# Patient Record
Sex: Female | Born: 1985 | Race: White | Hispanic: No | Marital: Single | Smoking: Former smoker
Health system: Southern US, Community
[De-identification: ages and names within clinical notes are randomized; demographics above are authoritative.]

## PROBLEM LIST (undated history)

## (undated) DIAGNOSIS — Z720 Tobacco use: Secondary | ICD-10-CM

## (undated) DIAGNOSIS — R519 Headache, unspecified: Secondary | ICD-10-CM

## (undated) DIAGNOSIS — IMO0002 Reserved for concepts with insufficient information to code with codable children: Secondary | ICD-10-CM

## (undated) DIAGNOSIS — Z789 Other specified health status: Secondary | ICD-10-CM

## (undated) DIAGNOSIS — O039 Complete or unspecified spontaneous abortion without complication: Secondary | ICD-10-CM

## (undated) HISTORY — PX: TONSILLECTOMY: SUR1361

## (undated) HISTORY — DX: Complete or unspecified spontaneous abortion without complication: O03.9

## (undated) HISTORY — DX: Other specified health status: Z78.9

## (undated) HISTORY — DX: Tobacco use: Z72.0

## (undated) HISTORY — DX: Reserved for concepts with insufficient information to code with codable children: IMO0002

## (undated) HISTORY — PX: OTHER SURGICAL HISTORY: SHX169

---
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
## 2005-02-06 ENCOUNTER — Ambulatory Visit: Payer: Self-pay | Admitting: Family Medicine

## 2005-09-03 ENCOUNTER — Ambulatory Visit: Payer: Self-pay | Admitting: Family Medicine

## 2006-09-08 ENCOUNTER — Ambulatory Visit: Payer: Self-pay | Admitting: Family Medicine

## 2006-09-08 ENCOUNTER — Encounter (INDEPENDENT_AMBULATORY_CARE_PROVIDER_SITE_OTHER): Payer: Self-pay | Admitting: Family Medicine

## 2006-09-08 LAB — CONVERTED CEMR LAB
Antibody Screen: NEGATIVE
Eosinophils Absolute: 0.1 10*3/uL (ref 0.0–0.7)
Eosinophils Relative: 1 % (ref 0–5)
HCT: 37.7 % (ref 36.0–46.0)
Hemoglobin: 13.3 g/dL (ref 12.0–15.0)
Hepatitis B Surface Ag: NEGATIVE
Lymphocytes Relative: 25 % (ref 12–46)
Lymphs Abs: 1.7 10*3/uL (ref 0.7–3.3)
MCV: 92 fL (ref 78.0–100.0)
Neutro Abs: 4.7 10*3/uL (ref 1.7–7.7)
Neutrophils Relative %: 67 % (ref 43–77)
Platelets: 294 10*3/uL (ref 150–400)
Rh Type: POSITIVE
Rubella: 9 intl units/mL — ABNORMAL HIGH

## 2006-09-10 ENCOUNTER — Inpatient Hospital Stay (HOSPITAL_COMMUNITY): Admission: AD | Admit: 2006-09-10 | Discharge: 2006-09-10 | Payer: Self-pay | Admitting: Obstetrics & Gynecology

## 2006-10-07 DIAGNOSIS — F329 Major depressive disorder, single episode, unspecified: Secondary | ICD-10-CM | POA: Insufficient documentation

## 2008-05-11 ENCOUNTER — Emergency Department (HOSPITAL_COMMUNITY): Admission: EM | Admit: 2008-05-11 | Discharge: 2008-05-11 | Payer: Self-pay | Admitting: Family Medicine

## 2008-12-21 ENCOUNTER — Emergency Department (HOSPITAL_COMMUNITY): Admission: EM | Admit: 2008-12-21 | Discharge: 2008-12-21 | Payer: Self-pay | Admitting: Family Medicine

## 2008-12-26 ENCOUNTER — Encounter (INDEPENDENT_AMBULATORY_CARE_PROVIDER_SITE_OTHER): Payer: Self-pay | Admitting: Family Medicine

## 2008-12-26 ENCOUNTER — Ambulatory Visit: Payer: Self-pay | Admitting: Family Medicine

## 2009-02-06 ENCOUNTER — Telehealth: Payer: Self-pay | Admitting: *Deleted

## 2009-02-14 ENCOUNTER — Ambulatory Visit: Payer: Self-pay | Admitting: Family Medicine

## 2009-02-14 ENCOUNTER — Encounter: Payer: Self-pay | Admitting: Family Medicine

## 2009-02-14 LAB — CONVERTED CEMR LAB: Beta hcg, urine, semiquantitative: NEGATIVE

## 2010-03-21 ENCOUNTER — Ambulatory Visit: Payer: Self-pay | Admitting: Family Medicine

## 2010-03-26 ENCOUNTER — Ambulatory Visit: Payer: Self-pay | Admitting: Family Medicine

## 2010-03-26 ENCOUNTER — Encounter: Payer: Self-pay | Admitting: Family Medicine

## 2010-03-26 LAB — CONVERTED CEMR LAB
ABO/RH(D): AB POS
Antibody Screen: NEGATIVE
Basophils Absolute: 0 10*3/uL (ref 0.0–0.1)
Basophils Relative: 0 % (ref 0–1)
Eosinophils Relative: 1 % (ref 0–5)
HCT: 37.7 % (ref 36.0–46.0)
Hemoglobin: 12.7 g/dL (ref 12.0–15.0)
Lymphocytes Relative: 24 % (ref 12–46)
MCHC: 33.7 g/dL (ref 30.0–36.0)
MCV: 94.3 fL (ref 78.0–100.0)
Monocytes Absolute: 0.4 10*3/uL (ref 0.1–1.0)
Monocytes Relative: 4 % (ref 3–12)
Neutrophils Relative %: 71 % (ref 43–77)
RBC: 4 M/uL (ref 3.87–5.11)
Rubella: 6.9 intl units/mL — ABNORMAL HIGH
Sickle Cell Screen: NEGATIVE
WBC: 8.9 10*3/uL (ref 4.0–10.5)
hCG, Beta Chain, Quant, S: 73846 milliintl units/mL

## 2010-03-28 ENCOUNTER — Ambulatory Visit: Payer: Self-pay | Admitting: Family Medicine

## 2010-03-28 ENCOUNTER — Encounter: Payer: Self-pay | Admitting: Family Medicine

## 2010-04-03 ENCOUNTER — Telehealth: Payer: Self-pay | Admitting: Family Medicine

## 2010-04-07 ENCOUNTER — Encounter: Payer: Self-pay | Admitting: Family Medicine

## 2010-04-07 ENCOUNTER — Ambulatory Visit: Payer: Self-pay | Admitting: Family Medicine

## 2010-04-07 DIAGNOSIS — R87619 Unspecified abnormal cytological findings in specimens from cervix uteri: Secondary | ICD-10-CM

## 2010-04-07 DIAGNOSIS — IMO0002 Reserved for concepts with insufficient information to code with codable children: Secondary | ICD-10-CM

## 2010-04-07 HISTORY — DX: Reserved for concepts with insufficient information to code with codable children: IMO0002

## 2010-04-07 HISTORY — DX: Unspecified abnormal cytological findings in specimens from cervix uteri: R87.619

## 2010-04-07 LAB — CONVERTED CEMR LAB: Chlamydia, DNA Probe: NEGATIVE

## 2010-04-10 ENCOUNTER — Ambulatory Visit (HOSPITAL_COMMUNITY): Admission: RE | Admit: 2010-04-10 | Discharge: 2010-04-10 | Payer: Self-pay | Admitting: Family Medicine

## 2010-04-10 ENCOUNTER — Encounter: Payer: Self-pay | Admitting: Family Medicine

## 2010-04-21 ENCOUNTER — Ambulatory Visit: Payer: Self-pay | Admitting: Family Medicine

## 2010-04-21 DIAGNOSIS — R87612 Low grade squamous intraepithelial lesion on cytologic smear of cervix (LGSIL): Secondary | ICD-10-CM | POA: Insufficient documentation

## 2010-04-21 HISTORY — DX: Low grade squamous intraepithelial lesion on cytologic smear of cervix (LGSIL): R87.612

## 2010-04-21 LAB — CONVERTED CEMR LAB

## 2010-05-05 ENCOUNTER — Ambulatory Visit: Payer: Self-pay | Admitting: Family Medicine

## 2010-05-08 ENCOUNTER — Telehealth: Payer: Self-pay | Admitting: *Deleted

## 2010-06-05 ENCOUNTER — Ambulatory Visit: Payer: Self-pay | Admitting: Family Medicine

## 2010-06-13 ENCOUNTER — Ambulatory Visit (HOSPITAL_COMMUNITY): Admission: RE | Admit: 2010-06-13 | Discharge: 2010-06-13 | Payer: Self-pay | Admitting: Family Medicine

## 2010-06-21 ENCOUNTER — Telehealth: Payer: Self-pay | Admitting: Family Medicine

## 2010-06-23 ENCOUNTER — Telehealth: Payer: Self-pay | Admitting: *Deleted

## 2010-07-16 ENCOUNTER — Telehealth: Payer: Self-pay | Admitting: Family Medicine

## 2010-07-21 ENCOUNTER — Ambulatory Visit: Payer: Self-pay | Admitting: Family Medicine

## 2010-08-07 ENCOUNTER — Ambulatory Visit: Admission: RE | Admit: 2010-08-07 | Discharge: 2010-08-07 | Payer: Self-pay | Source: Home / Self Care

## 2010-08-07 ENCOUNTER — Encounter: Payer: Self-pay | Admitting: Family Medicine

## 2010-08-08 LAB — CONVERTED CEMR LAB
HCT: 33.3 % — ABNORMAL LOW (ref 36.0–46.0)
HIV: NONREACTIVE
MCV: 96.2 fL (ref 78.0–100.0)
RDW: 14 % (ref 11.5–15.5)

## 2010-08-14 ENCOUNTER — Ambulatory Visit: Admit: 2010-08-14 | Payer: Self-pay | Admitting: Family Medicine

## 2010-08-25 ENCOUNTER — Encounter: Payer: Self-pay | Admitting: Family Medicine

## 2010-08-28 ENCOUNTER — Ambulatory Visit: Payer: Self-pay | Admitting: *Deleted

## 2010-08-28 ENCOUNTER — Ambulatory Visit: Admission: RE | Admit: 2010-08-28 | Discharge: 2010-08-28 | Payer: Self-pay | Source: Home / Self Care

## 2010-08-28 DIAGNOSIS — K219 Gastro-esophageal reflux disease without esophagitis: Secondary | ICD-10-CM

## 2010-08-28 HISTORY — DX: Gastro-esophageal reflux disease without esophagitis: K21.9

## 2010-09-11 NOTE — Assessment & Plan Note (Signed)
Summary: NOB 11 2/7   Vital Signs:  Patient profile:   25 year old female LMP:     01/18/2010 Height:      61.0 inches Weight:      123 pounds BMI:     23.32 BSA:     1.54 Temp:     98.4 degrees F Pulse rate:   83 / minute BP sitting:   111 / 68  Vitals Entered By: Jone Baseman CMA (April 07, 2010 8:35 AM) CC: NOB LMP (date): 01/18/2010 EDC by LMP==> 10/25/2010 EDC 10/25/2010 LMP - Reliable? approximate (month known) Menarche (age onset years): 12   Menses interval (days): 30 Menstrual flow (days): 7 On BCP's at conception: no Date of + home preg. test: 03/19/2010 Enter LMP: 01/18/2010   CC:  NOB.  Habits & Providers  Alcohol-Tobacco-Diet     Cigarette Packs/Day: n/a  Family History: ? Dad bipolar, No family h/o depression, bipolar, migraines  Mother with DM. Father died 09-11-2009 from emphysema (smoker, was pt at [facility]) age 69. No CA/CAD  Social History: Lives with mother.  BF excited about pregnancy and supportive.  Occ MJ use.  Quit smoking cigarettes when found out she was pregnant.  + second hand smoke (mother).  Unemployed.  Education:  GED Packs/Day:  n/a  Physical Exam  General:  Thin,in no acute distress; alert,appropriate and cooperative throughout examination Head:  Normocephalic and atraumatic without obvious abnormalities. No apparent alopecia or balding. Eyes:  No corneal or conjunctival inflammation noted. EOMI. Perrla. Vision grossly normal. Ears:  External ear exam shows no significant lesions or deformities.  Otoscopic examination reveals clear canals, tympanic membranes are intact bilaterally without bulging, retraction, inflammation or discharge. Hearing is grossly normal bilaterally. Mouth:  Oral mucosa and oropharynx without lesions or exudates.  Fair dentition. Neck:  No deformities, masses, or tenderness noted. No thyromegaly Breasts:  No mass, nodules, thickening, tenderness, bulging, retraction, inflamation, nipple discharge or skin  changes noted.  No axillary LAD Lungs:  Normal respiratory effort, chest expands symmetrically. Lungs are clear to auscultation, no crackles or wheezes. Heart:  Normal rate and regular rhythm. S1 and S2 normal without gallop, murmur, click, rub or other extra sounds. Abdomen:  ,abdomen soft and non-tender without masses, organomegaly or hernias noted. Genitalia:  Normal introitus for age, no external lesions, white vaginal discharge, mucosa pink and moist, no vaginal or cervical lesions, no vaginal atrophy, no friaility or hemorrhage, uterus c/w 10 weeks size, no adnexal masses or tenderness Psych:  Cognition and judgment appear intact. Alert and cooperative with normal attention span and concentration. No apparent delusions, illusions, hallucinations.  Appropriately anxious about viability of pregnancy given her h/o losses x 2   Impression & Recommendations:  Problem # 1:  PREGNANCY (ICD-V22.2) Doing well.  FHT not heard today but visualized on ultrasound. No need for early glucola (1 RF of mother with DM) Declined genetic screening. Quit cigarettes, but occ marijuana.  Encouraged cessation. Routine precautions given today. Check ultrasound to confirm dates Sept 1st. Orders: Pap Smear-[facility] ([id number]) GC/Chlamydia-[facility] (87591/87491) Prenatal U/S < 14 weeks - 29562  (Prenatal U/S) Other OB visit- [facility] (OBCK)  Problem # 2:  HYPEREMESIS GRAVIDARUM, MILD (ICD-643.10)  Trial of Zofran, B6 and Unisom.  Follow up 1 week to make sure she has improved and weight is at least stable.  Orders: Other OB visit- [facility] (OBCK)  Complete Medication List: 1)  Prenatal/iron Tabs (Prenatal multivit-min-fe-fa) 2)  Zofran Odt 8 Mg Tbdp (Ondansetron) .Marland Kitchen.. 1 by mouth  three times a day as needed nausea and vomiting  Patient Instructions: 1)  Things look good today.   2)  It's great that you quit smoking!  Now we should try to help your mother quit before the baby comes. 3)  Please schedule an appt in 1 week  with Dr. Fara Boros to see how the vomiting is doing. 4)  We have the appt scheduled for September 1st to get the ultrasound to check on the pregnancy. 5)  If you have severe pain or bleeding, please call us or go to [facility]. 6)  Congratulations! Prescriptions: ZOFRAN ODT 8 MG TBDP (ONDANSETRON) 1 by mouth three times a day as needed nausea and vomiting  #30 x 1   Entered and Authorized by:   Sean Macwilliams [place] MD   Signed by:   Aarilyn Dye [place] MD on 04/07/2010   Method used:   Electronically to        [facility] (retail)       [address]       [place], [place]  [address]       Ph: [phone]       Fax: [phone]   RxID:   [id number]   Prenatal Visit    FOB name: Truett Perna Concerns noted: Pt with h/o miscarriage x 2.  Very nervous about this pregnancy.  Desired pregnancy. EDC Confirmation:    New working [facility]: 10/25/2010    LMP reliable? 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 : approximate (month known)    Menarche: 12 years    Menses interval: 30 days    Menstrual flow 7 days    On BCP's at conception: no    Date of positive (+) home preg. test: 03/19/2010    Pre Pregnancy Weight: 125 lbs.    Symptoms since LMP: amenorrhea, nausea, vomiting, fatigue, tender breasts, urinary frequency    Other symptoms: Constipation  Prenatal Visit    FOB name: Truett Perna Concerns noted: Very anxious about pregnancy in light of previous SABs EDC Confirmation:    New working [facility] 10/25/2010    LMP reliable? approximate (month known)    Last menses onset (LMP) date: 01/18/2010    EDC by LMP: 10/25/2010   Past Pregnancy History    Gravida:     3    Term Births:     0    Premature Births:   0    Living Children:   0    Aborta:     2    Spont. Ab:     2  Pregnancy # 1    Comments:     SAB at about 12-13 weeks in 2007  Pregnancy # 2    Comments:     SAB at 7-8 weeks in 2010   Genetic History     Thalassemia:     mother: no    Neural tube defect:   mother: no    Down's Syndrome:   mother: no    Tay-Sachs:     mother: no    Sickle Cell Dz/Trait:   mother: no    Hemophilia:     mother: no    Muscular Dystrophy:   mother: no    Cystic Fibrosis:   mother: no    Huntington's Dz:   mother: no    Mental Retardation:   mother: yes       comments: ? sister's daughter with learning problems    Fragile X:     mother: no    Other Genetic or       Chromosomal Dz:   mother: no    Child with other       birth defect:     mother: no    > 3 spont. abortions:   mother: no    Hx of stillbirth:     mother: no  Additional Genetic Comments:    Father's family healthy.  Pt declines genetic testing.  Environmental Exposures    Medication, drug, or alcohol use since LMP: yes  Additional Infection/Environmental Comments:    Rare marijuana use - states it helps with appetite and nausea.   Flowsheet View for Follow-up Visit    Estimated weeks of       gestation:     11 2/7    Weight:     123    Blood pressure:   111 / 68    Headache:     +    Nausea/vomiting:   freq    Edema:     0    Vaginal bleeding:   no    Vaginal discharge:   d/c    FHR:       seen on ultrasound    Cx Dilation:     0    Cx Effacement:   0%    Cx Station:     high    Taking prenatal vits?   Y    Smoking:     n/a    Next visit:     1 wk    Comment:     Clear or white  discharge, no odor, no itching.  FHT not heard with doppler, but visualized with ultrasound  Appended Document: NOB 11 2/7     Clinical Lists Changes  Problems: Added new problem of RUBELLA NON-IMMUNE (ICD-V04.3)      Appended Document: NOB 11 2/7 PHQ-9 = 13, due to pregnancy related fatigue.

## 2010-09-11 NOTE — Assessment & Plan Note (Signed)
Summary: Denied phenergan refill  Denied fax refill request for phenergan.  She is well out of first trimester - it is not normal for her to have ongoing nausea requiring phenergan.  Needs an appointment

## 2010-09-11 NOTE — Assessment & Plan Note (Signed)
Summary: 10 weeks, still vomiting   Vital Signs:  Patient profile:   25 year old female Height:      61.0 inches Weight:      122.1 pounds Pulse rate:   84 / minute BP sitting:   114 / 71  (right arm) Cuff size:   regular  Vitals Entered By: Tessie Fass CMA (April 21, 2010 9:41 AM) CC: F/U vomiting Pain Assessment Patient in pain? no      LMP (date): 01/18/2010 EDC 11/13/2010   CC:  F/U vomiting.  Habits & Providers  Alcohol-Tobacco-Diet     Cigarette Packs/Day: n/a  Social History: Hepatitis Risk:  no  Physical Exam  General:  Well-developed,well-nourished,in no acute distress; alert,appropriate and cooperative throughout examination Mouth:  Poor dentition Lungs:  Normal respiratory effort, chest expands symmetrically. Lungs are clear to auscultation, no crackles or wheezes. Heart:  Normal rate and regular rhythm. S1 and S2 normal without gallop, murmur, click, rub or other extra sounds. Neurologic:  CN II-XII intact except VF not assessed.  Strength 5/5 thoroughout.  Sensation intact.  DTR's 4+ throughout.  No clonus. Rapid alternating movements and finger to nose intact.  Nl Heel, toe and tandem gait.  No pronator drift. Neg Romberg.   Impression & Recommendations:  Problem # 1:  PREGNANCY (ICD-V22.2) Doing well.  New EDD based on early ultrasound.  Bleeding and pain precautions given  Problem # 2:  HYPEREMESIS GRAVIDARUM, MILD (ICD-643.10) Can try outpatient pharmacy for ondansetron, o/w may try phenergan.  ENcouraged small frequent meals.  Well hydrated.  Small weight loss.  Follow up in 2 weeks, sooner if needed.  Problem # 3:  HEADACHE, UNSPECIFIED (ICD-784.0) Normal neuro exam.  Likely will improve with improved GI status.  Continue apap.    Problem # 4:  PAP SMEAR, LGSIL, ABNORMAL (ICD-795.09) Refer to colpo clinic.  Complete Medication List: 1)  Prenatal/iron Tabs (Prenatal multivit-min-fe-fa) 2)  Zofran Odt 8 Mg Tbdp (Ondansetron) .Marland Kitchen.. 1 by  mouth three times a day as needed nausea and vomiting 3)  Promethazine Hcl 25 Mg Tabs (Promethazine hcl) .Marland Kitchen.. 1 by mouth q 6 hours as needed nausea/vomiting.  will make you sleepy  Patient Instructions: 1)  I will give you a prescription for the Zofran.  You can try to take it to the [facility].   2)  I will send the phenergan prescription to the [facility] on [address]. 3)  Please see [place] in 2 weeks with Dr.McGill.   4)  Please go to the [facility] or call us if you have severe pain or bleeding. Prescriptions: PROMETHAZINE HCL 25 MG TABS (PROMETHAZINE HCL) 1 by mouth q 6 hours as needed nausea/vomiting.  Will make you sleepy  #60 x 1   Entered and Authorized by:   Sarah Swaziland MD   Signed by:   Sarah Swaziland MD on 04/21/2010   Method used:   Electronically to        [facility] (retail)       [address]       [place], [place]  [address]       Ph: [phone]       Fax: [phone]   RxID:   [id number]   Prenatal Visit Concerns noted: Still vomiting.  Able to keep fluids down, but not solids until evening. Getting HA nightly.  Throbbing.  Can't sleep.  Takes 2 tylenol q 6 hours.   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 test: 03/19/2010    Flowsheet View for Follow-up Visit    Estimated weeks of        gestation:     10 4/7    Weight:     122.1    Blood pressure:   114 / 71    Headache:     daily    Nausea/vomiting:   freq    Edema:     0    Vaginal bleeding:   no    Vaginal discharge:   no    Taking prenatal vits?   Y    Smoking:     n/a   Genetic History  Infection Risk History    High Risk Hepatitis B: no    Immunized against Hepatitis B: yes    Exposure to TB: no    Patient with history of Genital Herpes: no    Sexual partner with history of Genital Herpes: no    History of STD (GC, Chlamydia, Syphilis, HPV): no    Rash, Viral, or Febrile Illness since LMP: no    Exposure to Cat Litter: no    Chicken Pox Immune Status: Hx of Disease: Immune    History of Parvovirus (Fifth Disease): no    Occupational Exposure to Children: none  Environmental Exposures    Xray Exposure since LMP: no    Chemical or other exposure: no  Appended Document: 10 weeks, still vomiting     Clinical Lists Changes  Orders: Added new Test order of Medicaid OB visit - [facility] [phone]) - Signed

## 2010-09-11 NOTE — Assessment & Plan Note (Signed)
Summary: PER Dr. Drake Leach f/u [facility]   Vital Signs:  Patient profile:   25 year old female Weight:      125.3 pounds BP sitting:   110 / 70  Vitals Entered By: Arlyss Repress CMA, (May 05, 2010 2:51 PM)  Primary Care Amos Gaber:  Ardeen Garland  MD   History of Present Illness:    Emesis much improved with phenergan, was unable to afford Zofran and has improved therefore holding prescription. Now able to tolerate foods and liquids, appetite has improved ,weight up 3lbs. No further smoking, taking PNV at night, no abd pain, occ cramps  Reviewed earlier labs  Boyfriend present for visit  Habits & Providers  Alcohol-Tobacco-Diet     Cigarette Packs/Day: n/a  Physical Exam  General:  Well-developed,well-nourished,in no acute distress; alert,appropriate and cooperative throughout examination Vital signs noted  Mouth:  MMM, poor dentition Lungs:  normal WOB Abdomen:  soft, non-tender, and no distention.     Impression & Recommendations:  Problem # 1:  HYPEREMESIS GRAVIDARUM, MILD (ICD-643.10) Assessment Improved  continue as needed promethazine, noted weight gain, bp wnl. small frequent meals as tolerated  Orders: Other OB visit- [facility] (OBCK)  Problem # 2:  PREGNANCY (ICD-V22.2)  Progressing well, EDD 11/12/09. Declines early genetic screening. To be seen tomorrow for pregnancy medicaid, application RTC in 2 weeks, pt with a lot of anxiety about early miscarriage and emesis, more for reassurance, no other labs needed at this time.  Orders: Other OB visit- [facility] (OBCK)  Complete Medication List: 1)  Prenatal/iron Tabs (Prenatal multivit-min-fe-fa) 2)  Zofran Odt 8 Mg Tbdp (Ondansetron) .Marland Kitchen.. 1 by mouth three times a day as needed nausea and vomiting 3)  Promethazine Hcl 25 Mg Tabs (Promethazine hcl) .Marland Kitchen.. 1 by mouth q 6 hours as needed nausea/vomiting.  will make you sleepy  Patient Instructions: 1)  Congratulations on your new pregnancy! 2)  If you have any  concerns please feel free to call. 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    Newborn's physician: McGill  FOB Information    Husband/Father of baby: Truett Perna    FOB occupation [place]    Phone: [phone]    FOB Comments: Supportive,  no DV  Menstrual History    LMP (date): 01/18/2010    Menarche: 12 years    Menses interval: 30 days    Menstrual flow 7 days    On BCP's at conception: no    Date of positive (+) home preg. test: 03/19/2010   Flowsheet View for Follow-up Visit    Estimated weeks of       gestation:     12 4/7    Weight:     125.3    Blood pressure:   110 / 70    Headache:     daily    Nausea/vomiting:   freq    Edema:     0    Vaginal bleeding:   no  Vaginal discharge:   no    Fundal height:      ??    FHR:       160    Fetal activity:     N/A    Taking prenatal vits?   Y    Smoking:     n/a    Next visit:     2 wk    Resident:     [facility]

## 2010-09-11 NOTE — Assessment & Plan Note (Signed)
Summary: ob visit/eo   Vital Signs:  Patient profile:   25 year old female Height:      61.0 inches Weight:      138.3 pounds BMI:     26.23 Temp:     98.2 degrees F oral Pulse rate:   76 / minute BP sitting:   127 / 81  (right arm) Cuff size:   regular  Vitals Entered By: Jimmy Footman, CMA (August 28, 2010 9:09 AM)                                                                                CC: OB visit   29   0/7 Is Patient Diabetic? No Pain Assessment Patient in pain? no        Primary Care Provider:  Demetria Pore MD  CC:  OB visit   29   0/7.  History of Present Illness: Pt presents today at 40 and 0/7.  She continues to have significant problems with heartburn, not helped by tums.  She is snacking throughout the day and has attempted to decrease fatty foods, none of which have helped significantly.  She also has problems with nausea/vomiting, at least once per day, generally in the morning but can happen throughout the day.  This is worsened significantly by her heartburn and also by smells of food.  No bleeding, no regular contractions, no discharge.  Baby is moving regularly.  Reports foot swelling on occasion (2x/week) but no problems with headaches or visual changes.  Habits & Providers  Alcohol-Tobacco-Diet     Tobacco Status: never     Cigarette Packs/Day: n/a  Allergies: No Known Drug Allergies  Past History:  Past medical, surgical, family and social histories (including risk factors) reviewed for relevance to current acute and chronic problems.  Past Medical History: Reviewed history from 06/05/2010 and no changes required. Raped -age 68, counceling at family services G3P0 (misscarriage x 2)  Family History: Reviewed history from 04/07/2010 and no changes required. ? Dad bipolar, No family h/o depression, bipolar, migraines  Mother with DM. Father died 2009-09-24 from emphysema (smoker, was pt at [facility]) age 28. No CA/CAD  Social  History: Reviewed history from 04/07/2010 and no changes required. Lives with mother.  BF excited about pregnancy and supportive.  Occ MJ use.  Quit smoking cigarettes when found out she was pregnant.  + second hand smoke (mother).  Unemployed.  Smoking Status:  never  Physical Exam  General:  alert, well-developed, well-nourished, and well-hydrated.   Abdomen:  soft and non-tender.  No pain in RUQ with palpation, negative Murphy's. Fundal height 28cm Fetal heart rate  ~150 Extremities:  No pedal edema Neurologic:  alert & oriented X3, cranial nerves II-XII intact, strength normal in all extremities, and sensation intact to light touch.     Impression & Recommendations:  Problem # 1:  PREGNANCY (ICD-V22.2)  Pt progresses well.  Growth and weight gain is appropriate.  Do not feel that patient needs any additional imaging.  EDD 11/13/10.  Information provided about the procedure of epidural in case patient requires it.  Patient encouraged to schedule birthing class at [facility] hospital. To discuss practice where Berdia would  like to have routine pediatric care upon baby delivery. Orders: Medicaid OB visit - [facility] ([id number])  Problem # 2:  GASTROESOPHAGEAL REFLUX DISEASE (ICD-530.81) Pt appears to have significant problems with reflux that are exacerbating her problems with nausea.  Counciled to decrease fat intake, eat small meals and avoid spicy foods.  Will start of Ranitidine and B6 and see back in two weeks.  If this does not help her nausea significantly will consider other tx options but feel that there is a significant amount of reflux causing her problems with nausea/vomiting.  Her updated medication list for this problem includes:    Ranitidine Hcl 150 Mg Caps (Ranitidine hcl) .Marland Kitchen... Take one pill two times daily for heartburn  Complete Medication List: 1)  Prenatal/iron Tabs (Prenatal multivit-min-fe-fa) 2)  Ranitidine Hcl 150 Mg Caps (Ranitidine hcl) .... Take one pill two times daily  for heartburn 3)  Vitamin B-6 25 Mg Tabs (Pyridoxine hcl) .... Take one pill twice daily for nausea  Patient Instructions: 1)  It was a pleasure to see you in OB clinic today.  You are at 29 weeks and 0 days today.  Your measurements and weight gain are healthy for your stage of pregnancy.  2)  For the nausea and vomiting, we are treating you for the heartburn which we think is causing the vomiting.  We sent prescriptions to [facility] on [address] for: 3)  Vitamin B6 25mg , take 1 tablet twice daily;  4)  Ranitidine 150mg  tablets, take 1 tablet twice daily.  5)  In addition, try to reduce the fat content in your diet (such as reducing the fat content in the milk you are drinking).  6)  It is very important to have you back in 2 weeks to see Dr. Fara Boros for your next visit.  Please call us ahead of time if you are not getting better.  7)  [facility] Labor Classes can be scheduled by calling [phone] or [phone]. 8)  MAKE APPT OB VISIT WITH DR MCGILL IN 2 WEEKS Prescriptions: VITAMIN B-6 25 MG TABS (PYRIDOXINE HCL) Take one pill twice daily for nausea  #60 x 0   Entered and Authorized by:   Majel Homer MD   Signed by:   Majel Homer MD on 08/28/2010   Method used:   Electronically to        [facility] (retail)       [address]       [place], [place]  [address]       Ph: [phone]       Fax: [phone]   RxID:   [id number] RANITIDINE HCL 150 MG CAPS (RANITIDINE HCL) Take one pill two times daily for heartburn  #60 x 0   Entered and Authorized by:   Majel Homer MD   Signed by:   Majel Homer MD on 08/28/2010   Method used:   Electronically to        [facility] (retail)       [address]       [place], [place]  [address]       Ph: [phone]       Fax: [phone]   RxID:   [id number]    Orders Added: 1)  Medicaid OB visit - [facility] [[id number]]     Flowsheet View for Follow-up Visit    Estimated  weeks  of       gestation:     29 0/7    Weight:     138.3    Blood pressure:   127 / 81    Fundal height:      28    FHR:       150    Taking Vitamins?   Y    Smoking PPD:   n/a    Comment:     Heartburn and nausea    Next visit:     2 wk    Resident:     Louanne Belton    Preceptor:     Mauricio Po

## 2010-09-11 NOTE — Progress Notes (Signed)
Summary: needs note   Phone Note Call from Patient Call back at [phone]   Caller: Patient Summary of Call: trying to get [facility] Medicaid and needs a copy of due date and verification of pregnancy  faxed [phone] - Attn: Phillips Climes Initial call taken by: De Nurse,  May 08, 2010 2:02 PM  Follow-up for Phone Call        faxed OB verification form to [phone]. attemted to call pt to notify, no answer or voice mail. Follow-up by: Tessie Fass CMA,  May 08, 2010 5:07 PM

## 2010-09-11 NOTE — Progress Notes (Signed)
Summary: Phone note - cough, pregnant  Call from patient at home:  Pregnant - about 19 weeks. URI symptoms, no fever. Wants to know if she can take Robitussin DM - advised to try cough drops for cough relief, advised to take Tylenol as needed for any aches and pains. Patient expressed understanding  Bobby Rumpf  MD  June 21, 2010 9:01 AM .

## 2010-09-11 NOTE — Assessment & Plan Note (Signed)
Summary: ob f/u [facility]   Vital Signs:  Patient profile:   25 year old female Weight:      132.4 pounds Temp:     98.5 degrees F oral Pulse rate:   102 / minute Pulse rhythm:   regular BP sitting:   115 / 72  (left arm) Cuff size:   regular  Vitals Entered By: Loralee Pacas CMA (July 21, 2010 8:59 AM)  Primary Wally Shevchenko:  Demetria Pore MD   History of Present Illness: G3P0 @ 23.4, [facility] 11/13/10 presents for OB visit.  Is having headaches daily x2 weeks.  Tylenol doesn't help so she doesn't do anything for them. No photo/phonophobia, no nausea/vomiting associated, but does continue to vomit 1x/day, usually first thing in the morning.  Sometimes wakes up with headaches. No CP, SOB, edema, blurry vision.  Temporal, "tight," "feels like when you have your sunglasses on the side of your head."  Has had a lot of stress recently-- mom had heart attack, had to put her dog down, car problems.   Also complains of rash behind her right ear, also on her back and small patch on her face.  Was present when she saw Dr. Swaziland but wasn't bothering her as much.  Itches. Using lotion to try to help since it feels like it's dry.  Wants to try breast feeding. Wants progestin-only mini pills for BC. Does NOT want epidural, would like to try natural or IV pain meds only.   Current Medications (verified): 1)  Prenatal/iron  Tabs (Prenatal Multivit-Min-Fe-Fa) 2)  Promethazine Hcl 25 Mg Tabs (Promethazine Hcl) .Marland Kitchen.. 1 By Mouth Q 6 Hours As Needed Nausea/vomiting.  Will Make You Sleepy  Allergies (verified): No Known Drug Allergies  Physical Exam  General:  alert, well-developed, well-nourished, and well-hydrated.   Abdomen:  Fundal height: 23cm FHTs 147-152 Skin:  Large, mildly erythematous, scaling patches behind right ear and on back.  Some patches well defined circles, some 1x1cm, others 5x5cm. Small, scaling 2x2cm patch on left cheek.   Scraping showed "spagetti and meatballs" classic pattern on KOH.       Impression & Recommendations:  Problem # 1:  PREGNANCY (ICD-V22.2)  23.4, [facility]C 11/13/10. No acute complaints. No bleeding or discharge. Labor precautions and kick counts discussed.  No epidural desired, wants to try breast feeding, which was strongly encouraged, wants mini pill for birth control (depo made her gain weight).  Will discuss more long-term options at another time. Counseled on the need to take the pill at the same time every day.   Orders: Medicaid OB visit - [facility] ([address])  Problem # 2:  SKIN RASH (ICD-782.1) KOH + for yeast.  Will give nystatin ointment (medicaid preferred) for treatment.   Her updated medication list for this problem includes:    Nystatin 100000 Unit/gm Oint (Nystatin) .Marland Kitchen... Apply liberally to affected areas 2-3 times per day until rash resolves  Orders: KOH-[facility] (60[address]) Medicaid OB visit - [facility] (09811)  Complete Medication List: 1)  Prenatal/iron Tabs (Prenatal multivit-min-fe-fa) 2)  Zofran Odt 8 Mg Tbdp (Ondansetron) .Marland Kitchen.. 1 by mouth three times a day as needed nausea and vomiting 3)  Promethazine Hcl 25 Mg Tabs (Promethazine hcl) .Marland Kitchen.. 1 by mouth q 6 hours as needed nausea/vomiting.  will make you sleepy 4)  Nystatin 100000 Unit/gm Oint (Nystatin) .... Apply liberally to affected areas 2-3 times per day until rash resolves  Patient Instructions: 1)  It was great seeing you! 2)  Please come back in 3-4 weeks  for your next appt, 1 hour glucola and other 28 week labs.  You can make an appointment with the [facility] clinic or with me. 3)  It looks like you have a fungal (yeast) infection on your skin.  Please use the medicine I have prescribing for you 2-3 times per day on the affected areas until it clears up.  Feel free to use this any time that you have this rash in the future. Prescriptions: ZOFRAN ODT 8 MG TBDP (ONDANSETRON) 1 by mouth three times a day as needed nausea and vomiting  #30 x 1   Entered and Authorized by:   Demetria Pore MD   Signed by:    Demetria Pore MD on 07/21/2010   Method used:   Electronically to        [facility] (retail)       [address]       [place], [place]  [address]       Ph: [phone]       Fax: [phone]   RxID:   [id number] NYSTATIN 100000 UNIT/GM OINT (NYSTATIN) Apply liberally to affected areas 2-3 times per day until rash resolves  #1 large tube x 2   Entered and Authorized by:   Demetria Pore MD   Signed by:   Demetria Pore MD on 07/21/2010   Method used:   Electronically to        [facility] (retail)       [address]       [place], [place]  [address]       Ph: [phone]       Fax: [phone]   RxID:   [id number]    Orders Added: 1)  KOH-[facility] [87220] 2)  Medicaid OB visit - [facility] [[id number]]      Flowsheet View for Follow-up Visit    Estimated weeks of       gestation:     23 4/7    Weight:     132.4    Blood pressure:   115 / 72    Headache:     daily    Nausea/vomiting:   vom1/d    Edema:     TrLE    Vaginal bleeding:   no    Vaginal discharge:   no    Fundal height:      23    FHR:       150    Fetal activity:     yes    Labor symptoms:   no    Taking prenatal vits?   Y    Smoking:     n/a    Next visit:     4 wk    Resident:     McGill    Preceptor:     McDiarmid    Comment:     Daily headache x2 weeks    Flowsheet View for Follow-up Visit    Estimated weeks of       gestation:     23 4/7    Weight:     132.4    Blood pressure:   115 / 72    Hx headache?     daily    Nausea/vomiting?   vom1/d    Edema?     TrLE    Bleeding?     no    Leakage/discharge?  no    Fetal activity:       yes    Labor symptoms?   no    Fundal height:      23    FHR:       150    Taking Vitamins?   Y    Smoking PPD:   n/a    Comment:     Daily headache x2 weeks    Next visit:     4 wk    Resident:     McGill    Preceptor:     McDiarmid  Laboratory Results  Date/Time Received: July 21, 2010 9:30 AM  Date/Time Reported: July 21, 2010 9:53 AM   Other Tests  Skin KOH: Positive Comments: ...............test performed by......Marland KitchenBonnie A. Swaziland, MLS (ASCP)cm

## 2010-09-11 NOTE — Assessment & Plan Note (Signed)
Summary: preg test,df   Vital Signs:  Patient profile:   25 year old female Height:      61.0 inches Weight:      125 pounds BMI:     23.70 Temp:     98.5 degrees F oral Pulse rate:   90 / minute BP sitting:   110 / 67  (left arm) Cuff size:   regular  Vitals Entered By: Tessie Fass CMA (March 21, 2010 10:47 AM) CC: upreg   Primary Care Provider:  Ardeen Garland  MD  CC:  upreg.  History of Present Illness: Here for pregnancy test, she knows that she is pregnant.  She had previous miscarrage and is highly anxious.  She has reduced her smoking but still smoking 3-4 cigs per day.  Smoked marijuana but has stopped she she found out that she was pregnant. She denies use of narcotics or cocaine.   She has prenatal vitamins at home from last pregnancy that she has started taking.   Impression & Recommendations:  Problem # 1:  PREGNANCY (ICD-V22.2)  WIll get established with MD provider to follow her pregnancy.  Orders: [facility]- Est Level  2 ([address])  Complete Medication List: 1)  Prenatal/iron Tabs (Prenatal multivit-min-fe-fa)  Other Orders: U Preg-[facility] ([address])  Patient Instructions: 1)  Eat heathy:  lots of liquids and fruits and veges 2)  Stop smoking 3)  begin vitamins 4)  Terese Door will call you either Monday or Tuesday  Laboratory Results   Urine Tests  Date/Time Received: March 21, 2010 10:59 AM  Date/Time Reported: March 21, 2010 11:03 AM     Urine HCG: positive Comments: ...............test performed by......Marland KitchenBonnie A. Swaziland, MLS (ASCP)cm      Appended Document: Orders Update     Clinical Lists Changes  Orders: Added new Test order of [facility] ([id number]) - Signed Added new Test order of Urine Culture-[facility] [id number]) - Signed Added new Test order of HIV-[facility] ([id number]) - Signed Added new Test order of Sickle Cell Scr-[facility] ([id number]) - Signed Added new Test order of B-HCG Quant-[facility] ([id number]) - Signed Added new Test  order of Miscellaneous Lab Charge-[facility] [phone]) - Signed

## 2010-09-11 NOTE — Progress Notes (Signed)
Summary: Lab Res/ Mon appt   Phone Note Call from Patient Call back at Home Phone [phone]   Caller: Patient Summary of Call: Pt checking on labs from Friday. Initial call taken by: Clydell Hakim,  April 03, 2010 9:03 AM  Follow-up for Phone Call        gave her lab results. she wanted to know about the "hormones" test. states she had a miscarriage. will ask md to call her to discuss HCG Follow-up by: Golden Circle RN,  April 03, 2010 9:14 AM    Additional Follow-up for Phone Call Additional follow up Details #2::    Called pt back at home (8/28 @ 3pm) Her specific question was whether or not Bhcg was rising appropriately. Told her that it had risen (8/17: 14782 --> 90475 on 8/19). States she thinks she is approx. 10.5 weeks. I explained that the values can vary a lot person to person, so they are not the only thing we look at to see if a pregnancy appears to be going well, but they seem appropriate for her approx. GA and that she could discuss it in more detail with Dr. Swaziland at her appt tomorrow. She stated that she is very anxious due to prior miscarriage and wants to make sure everything is ok.    I did not want to give her false information, so I did not tell her that it means everything is absolutely fine, however,  since B-hcg usually peaks at 8-12 weeks, it seems to be appropriate that it rose; I would expect that in the next few weeks it would begin to start falling.   Follow-up by: Demetria Pore MD,  April 06, 2010 3:04 PM

## 2010-09-11 NOTE — Progress Notes (Signed)
   Phone Note Call from Patient   Caller: Patient Call For: [phone] Summary of Call: pt is [redacted] wks pregnant and want to know what she can take for a cold.   Initial call taken by: Abundio Miu,  June 23, 2010 8:52 AM  Follow-up for Phone Call        No fever, mostly head and sinus congestion.  Recommended saline nasal spray and Tylenol for head pressure.  Pt agreed to try these. Follow-up by: Dennison Nancy RN,  June 23, 2010 10:44 AM

## 2010-09-11 NOTE — Progress Notes (Signed)
   Phone Note Outgoing Call   Call placed by: Demetria Pore MD,  July 16, 2010 8:54 AM Call placed to: Patient Summary of Call: Called pt to check in since she has missed her last 2 OB appts.  Pt informed me that her mother had a heart attack, she had to put her dog down, and her car died.  She is not having any issues with the pregnancy, itself, and was  very apologetic for missing the appts.  Found out she is having a girl, naming her Tiana.    Had been planning to call clinic this morning to reschedule with me.  She can be double booked on this Friday, or can come Mon or Tues morning since I am in clinic.  She also needs refill of promethazine b/c she is still getting sick at least once per day.  I will refill for her.  She will leave pharmacy details with front desk when she calls to reschedule.

## 2010-09-11 NOTE — Progress Notes (Signed)
   Phone Note Call from Patient   Caller: Patient Summary of Call: Pt rescheduled her appt for Monday at 8:30.  You can call in her meds for nausea Initial call taken by: Abundio Miu,  July 16, 2010 10:41 AM  Follow-up for Phone Call        I have sent her promethazine to [facility] on [address].  Please let the pt know that she can pick it up. Thanks! Follow-up by: Demetria Pore MD,  July 16, 2010 6:23 PM    Prescriptions: PROMETHAZINE HCL 25 MG TABS (PROMETHAZINE HCL) 1 by mouth q 6 hours as needed nausea/vomiting.  Will make you sleepy  #60 x 1   Entered and Authorized by:   Demetria Pore MD   Signed by:   Demetria Pore MD on 07/16/2010   Method used:   Electronically to        [facility] (retail)       [address]       [place], [place]  [address]       Ph: [phone]       Fax: [phone]   RxID:   [id number]
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
## 2010-09-12 ENCOUNTER — Encounter: Payer: Self-pay | Admitting: Family Medicine

## 2010-09-12 ENCOUNTER — Encounter (INDEPENDENT_AMBULATORY_CARE_PROVIDER_SITE_OTHER): Payer: Self-pay | Admitting: Family Medicine

## 2010-09-12 ENCOUNTER — Ambulatory Visit: Admit: 2010-09-12 | Payer: Self-pay

## 2010-09-12 DIAGNOSIS — K219 Gastro-esophageal reflux disease without esophagitis: Secondary | ICD-10-CM

## 2010-09-12 DIAGNOSIS — Z3480 Encounter for supervision of other normal pregnancy, unspecified trimester: Secondary | ICD-10-CM

## 2010-09-17 NOTE — Assessment & Plan Note (Signed)
Summary: ob visit,eo    Vitals: 139 lb 118/75, HR 96      Flowsheet View for Follow-up Visit    Estimated weeks of       gestation:     31 1/7    Weight:     139    Blood pressure:   118 / 75    Hx headache?     No    Nausea/vomiting?   vom1/d    Edema?     TrLE    Bleeding?     no    Leakage/discharge?   no    Fetal activity:       yes    Labor symptoms?   no    Fundal height:      31    FHR:       147    Taking Vitamins?   Y    Smoking PPD:   n/a    Comment:     Heartburn continues    Next visit:     2 wk    Resident:     Lygia Olaes    Preceptor:     [place]   Impression & Recommendations:  Problem # 1:  PREGNANCY (ICD-V22.2)  25 yo G3P0020 @ 31.1 EDC 11/13/10 by early (9wk) [place]  Initial labs:AB+, ab neg, hgb 12.7, hep B neg, RPR NR, HIV NR, RUBELLA NONIMMUNE, GC/Chlam neg, [place] neg, GBS neg 28 week labs: hgb 10.9, RPR NR, HIV NR, 1hr GTT 117 (nml)  No epidural, breast, pill, [facility]/Dr. Clydene Burack;  Having a girl-- [place]  Issues this pregnancy: 1. Heartburn/nausea- continues to be an issue, but better w/ ranitidine. Using multiple pillows to sleep.  Encouraged adding additional pillow or elevating HOB, crackers at bedside, etc.  Also suggested TUMs as needed for breakthrough heartburn. Pt gaining weight appropriately. 2. Ultrasound- Would like another [place] to see baby again. Knows it is a girl. Gave her information/# for [facility]. 3. Birth plan- Pt would like to try to have a natural birth, no epidural.  Wondering if there are showers/tubs at [facility]. Encouraged her to go to classes or walk-through at [facility] to discuss.  Will d/w midwife to see what our options are to help achieve this goal. 4. Rubella Non-immune/equivocal- recheck titers post preg +/- MMR booster 5. Abnml Pap- LSIL 8/11; recheck pap in 6-12 months with reflex HPV testing if + (cannot find reflex HPV from this pap)  Will need GBS after 35 weeks, ?repeat GC/Chlam Discussed PTL precautions and kick counts.  Discussed benefits to mom and baby of breast feeding and starting with breast then moving to bottle if needed. F/u 2 weeks.   Orders: Medicaid OB visit - [facility] ([id number])  Problem # 2:  GASTROESOPHAGEAL REFLUX DISEASE (ICD-530.81)  Cont ranitidine, encourage reflux precautions esp at bedtime, use Tums for breakthrough nausea/reflux. Her updated medication list for this problem includes:    Ranitidine Hcl 150 Mg Caps (Ranitidine hcl) .Marland Kitchen... Take one pill two times daily for heartburn  Orders: Medicaid OB visit - [facility] ([id number])  Complete Medication List: 1)  Prenatal/iron Tabs (Prenatal multivit-min-fe-fa) 2)  Ranitidine Hcl 150 Mg Caps (Ranitidine hcl) .... Take one pill two times daily for heartburn 3)  Vitamin B-6 25 Mg Tabs (Pyridoxine hcl) .... Take one pill twice daily for nausea   Patient Instructions: 1)  It was great seeing you today! 2)  Please remember to take your prenatal vitamin every day. Also, if you can, please start taking  an extra iron pill (FeSO4 325 mg) every day or increase the amount foods with iron in them you eat to help with your iron levels. 3)  The maximum amt of ranitidine that you should take a day is 300mg  (150mg  two times a day). You can use Tums for breakthrough nausea. Also, sleeping sitting up or adding another couple of pillows might help, as well as eating a few crackers in the middle of the night. 4)  Please go to the ER at [facility] if you begin experiencing vaginal bleeding, contractions every 5 mins or if your water breaks. 5)  If you feel like the baby is moving less, please count the number of times that you are kicked in one hour. If it is 5 or more, everything is great. If it is less than 5, please count for another hour. If after 2 hours it is less than 10 please go to the MAU. 6)  [facility], including birthing classes. You can call them at [phone] or [phone]. 7)  You can call and schedule and ultrasound at [facility]- [phone]. 8)  Come back and see me in 2 weeks!  Physical Exam  General:  alert, well-developed, well-nourished, well-hydrated, appropriate dress, and normal appearance.   Abdomen:  soft and non-tender, gravid uterus, baby VTX.   FH: 31cm FHTs: 147   Primary Provider:  Demetria Pore MD   History of Present Illness: 25 yo G3P0020 @ 31.1 EDC 11/13/10 by LMP  Only concern/problem is continued heartburn. Still vomiting  ~ 1x/day and still having nausea/heartburn. Ranitidine is helping, but sometimes takes 3 instead of 2 because 3 seems to work better. Sleeping with 2-3 pillows at night to help keep head up.  Having a baby girl-- name Nichole Hampton  Would like to have a natural birth, w/o epidural. Interested in tub/showers at [facility]. Would like to know what's available there.  Also wondering how many ppl can be in delivery room during delivery-- would like "T" (bf), friend and mom present if possible.  Wants to try breast feeding, but likely will do breast and bottle.  Wants the pill for [facility], but is thinking about a depo shot prior to d/c from hospital. Will bring Nichole Hampton here to [facility]

## 2010-09-25 ENCOUNTER — Encounter: Payer: Self-pay | Admitting: *Deleted

## 2010-09-26 ENCOUNTER — Ambulatory Visit: Payer: Medicaid Other | Admitting: Family Medicine

## 2010-09-26 ENCOUNTER — Encounter: Payer: Self-pay | Admitting: Family Medicine

## 2010-09-26 VITALS — BP 110/67 | Wt 144.1 lb

## 2010-09-26 DIAGNOSIS — K219 Gastro-esophageal reflux disease without esophagitis: Secondary | ICD-10-CM

## 2010-09-26 DIAGNOSIS — R8789 Other abnormal findings in specimens from female genital organs: Secondary | ICD-10-CM

## 2010-09-26 DIAGNOSIS — F329 Major depressive disorder, single episode, unspecified: Secondary | ICD-10-CM

## 2010-09-26 DIAGNOSIS — Z3400 Encounter for supervision of normal first pregnancy, unspecified trimester: Secondary | ICD-10-CM

## 2010-09-26 NOTE — Progress Notes (Signed)
Subjective:    Nichole Hampton is a 25 y.o. female being seen today for her obstetrical visit. She is at [redacted]w[redacted]d gestation. Patient reports no complaints. Fetal movement: normal.  Objective:    BP 110/67  Wt 144 lb 1.6 oz (65.363 kg)  LMP 01/18/2010  Physical Exam  Exam  FHT:  156 BPM  Uterine Size: size equals dates  Presentation: cephalic     Assessment:    Pregnancy:  G3P0020    Plan:    Patient Active Problem List  Diagnoses  . DEPRESSIVE DISORDER, NOS  . PAP SMEAR, LGSIL, ABNORMAL  . GASTROESOPHAGEAL REFLUX DISEASE  . Pregnancy, supervision, normal, first    Pediatrician: discussed, selected and will be Dr. Triniti Gruetzmacher/[facility]. Infant feeding: plans to breastfeed, plans to bottle feed. Cigarette smoking: quit when found out she was pregnant. Follow up in 2 Weeks.

## 2010-09-26 NOTE — Assessment & Plan Note (Signed)
Ranitidine is helping. No acute problems. Still vomiting 1x/day but gaining weight and able to eat and drink appropriately.  Continue current medication and monitoring.

## 2010-09-26 NOTE — Assessment & Plan Note (Signed)
Mood has been good. No concerns or complaints. Excited about baby. Supportive bf and families.

## 2010-09-26 NOTE — Patient Instructions (Addendum)
You and the baby look great! Good luck getting everything together for her arrival!  Kick Count  Fetal Movement Counts  Kick counts is highly recommended in high risk pregnancies, but it is a good idea for every pregnant woman to do. Start counting fetal movements at 28 weeks of the pregnancy. Fetal movements increase after eating a full meal or eating or drinking something sweet (the blood sugar is higher). It is also important to drink plenty of fluids (well hydrated) before doing the count. Lie on your left side because it helps with the circulation or you can sit in a comfortable chair with your arms over your belly (abdomen) with no distractions around you.  DOING THE COUNT:   Try to do the count the same time of day each time you do it.   Mark the day and time, then see how long it takes for you to feel 10 movements (kicks, flutters, swishes, rolls). You should have at least 10 movements within 2 hours. You will most likely feel 10 movements in much less than 2 hours. If you do not, wait an hour and count again. After a couple of days you will see a pattern.   What you are looking for is a change in the pattern or not enough counts in 2 hours. Is it taking longer in time to reach 10 movements?  SEEK MEDICAL CARE IF:   You feel less than 10 counts in 2 hours. Tried twice.   No movement in one hour.   The pattern is changing or taking longer each day to reach 10 counts in 2 hours.   You feel the baby is not moving as it usually does.

## 2010-09-26 NOTE — Assessment & Plan Note (Signed)
Will need colpo clinic referral after she delivers.

## 2010-09-26 NOTE — Assessment & Plan Note (Signed)
[id number] @ 33.1 with EDC 11/13/10 -Doing well, no complaints or difficulties, feeling the best she has all pregnancy -Has baby shower the beginning of March, getting excited, boyfriend and families are very supportive -Does not want to go to birth classes at [facility] -Breast -Natural/no epidural -Depo then OCPs  -[facility]/Dr. Aydden Cumpian  -Will need to do GBS, GC/Chlam at next visit.  -Preterm labor, kick counts discussed

## 2010-10-10 ENCOUNTER — Encounter: Payer: Medicaid Other | Admitting: Family Medicine

## 2010-10-15 ENCOUNTER — Ambulatory Visit (INDEPENDENT_AMBULATORY_CARE_PROVIDER_SITE_OTHER): Payer: Medicaid Other | Admitting: Family Medicine

## 2010-10-15 VITALS — BP 110/60 | Wt 146.3 lb

## 2010-10-15 DIAGNOSIS — Z3400 Encounter for supervision of normal first pregnancy, unspecified trimester: Secondary | ICD-10-CM

## 2010-10-15 NOTE — Assessment & Plan Note (Addendum)
[id number] @ 35.6 with EDC 11/13/10 -Doing well, no complaints or difficulties, feeling the best she has all pregnancy; still having some nausea but is tolerating -Some thin vaginal d/c but no bleeding or ROM -Occ ctx, not regular, not painful -Boyfriend and families are very supportive, nursery is set up -Does not want to go to birth classes at [employer]  -Breast -Natural/no epidural; will likely involve midwives at the time of laboring for assistance/techniques -Depo then OCPs  -[facility]/Dr. Brinlyn Cena  -Will need to do GBS, GC/Chlam at next visit.  -Pt is concerned about fetal position-- equivocal Leopold's today, will use U/S at next visit to confirm -Preterm labor, kick counts discussed -Discussed reasons to go to MAU

## 2010-10-15 NOTE — Progress Notes (Signed)
Subjective:    Nichole Hampton is a 25 y.o. female being seen today for her obstetrical visit. She is at [redacted]w[redacted]d gestation. Patient reports nausea, no bleeding, no leaking, occasional contractions and thin vaginal d/c as well as lower ext swelling relieved by elevating her feet. Fetal movement: normal.  Objective:    BP 110/60  Wt 146 lb 4.8 oz (66.361 kg)  LMP 01/18/2010  Physical Exam  Maternal Exam:  Abdomen: Patient reports no abdominal tenderness. Fundal height is 34cm.   Fetal presentation: vertex  Introitus: not evaluated.   Ferning test: not done.  Nitrazine test: not done. Amniotic fluid character: not assessed.  Cervix: not evaluated.   Fetal Exam Fetal Monitor Review: Mode: hand-held doppler probe.   Baseline rate: 138.       FHT:  138 BPM  Uterine Size: size equals dates  Presentation: unsure     Assessment:    Pregnancy:  G3P0020    Plan:    Patient Active Problem List  Diagnoses  . DEPRESSIVE DISORDER, NOS  . PAP SMEAR, LGSIL, ABNORMAL  . GASTROESOPHAGEAL REFLUX DISEASE  . Pregnancy, supervision, normal, first    Pediatrician: discussed and selected. Infant feeding: plans to breastfeed. Follow up in 1 Week.

## 2010-10-20 LAB — GLUCOSE, CAPILLARY: Glucose-Capillary: 117 mg/dL — ABNORMAL HIGH (ref 70–99)

## 2010-10-24 ENCOUNTER — Ambulatory Visit (INDEPENDENT_AMBULATORY_CARE_PROVIDER_SITE_OTHER): Payer: Medicaid Other | Admitting: Family Medicine

## 2010-10-24 ENCOUNTER — Other Ambulatory Visit: Payer: Self-pay | Admitting: Family Medicine

## 2010-10-24 VITALS — BP 84/60 | Temp 98.7°F | Wt 140.4 lb

## 2010-10-24 DIAGNOSIS — Z331 Pregnant state, incidental: Secondary | ICD-10-CM

## 2010-10-24 DIAGNOSIS — Z3400 Encounter for supervision of normal first pregnancy, unspecified trimester: Secondary | ICD-10-CM

## 2010-10-24 NOTE — Assessment & Plan Note (Signed)
[id number] @ 37.1 with EDC 11/13/10 -Doing well, no complaints or difficulties with exception of getting over stomach virus (nasuea, vomiting, diarrhea; has been able to eat today, but was only eating minimal yest and nothing the day before), also has some nasal congestion, rhinorrhea, ST, ?fever but did not take temp at home -No vaginal bleeding or d/c -Occ ctx, not regular, not painful (had 3 sets of 3 ctx, 8 min apart), has pelvic pressure all of the time, worse after urinating -Boyfriend and families are very supportive, nursery is set up -Does not want to go to birth classes at [employer]  -Breast -Natural/no epidural; will likely involve midwives at the time of laboring for assistance/techniques -Depo then OCPs  -[facility]/Dr. Tametria Aho  -GBS, GC/Chlam done today -Baby is vertex by Leopold's and U/S  -Preterm labor, kick counts discussed -Discussed reasons to go to MAU -F/u 1 week

## 2010-10-24 NOTE — Progress Notes (Signed)
Subjective:    Nichole Hampton is a 25 y.o. female being seen today for her obstetrical visit. She is at [redacted]w[redacted]d gestation. Patient reports nausea, no bleeding, no cramping, no leaking, occasional contractions and vomiting. Pt has had viral infection over the past few days which started with diarrhea, then nausea/vomiting, unable to eat for 1-2 days but has started being able to keep down clears and solids.  Now has some congestion, rhinorrhea, and sore throat.  Thinks she may have had a fever over the past few days but unsure.  Also having increased pelvic pressure, esp after she empties her bladder.  Also having occ contractions.  Fetal movement: normal.  Objective:    BP 84/60  Temp 98.7 F (37.1 C)  Wt 140 lb 6.4 oz (63.685 kg)  LMP 01/18/2010  Physical Exam  GI: There is tenderness.    Maternal Exam:  Uterine Assessment: Contraction strength is mild.  Contraction frequency is irregular.   Abdomen: Fundal height is 33cm (?baby has started descending into pelvis).   Fetal presentation: vertex  Introitus: Normal vulva. Normal vagina.  Ferning test: not done.  Nitrazine test: not done. Amniotic fluid character: not assessed.  Cervix: Cervix evaluated by digital exam.   Thin, clear d/c  Fetal Exam Fetal Monitor Review: Mode: hand-held doppler probe.   Baseline rate: 159.       FHT:  159 BPM  Uterine Size: size less than dates  Presentation: cephalic     Assessment:    Pregnancy:  G3P0020    Plan:    Patient Active Problem List  Diagnoses  . DEPRESSIVE DISORDER, NOS  . PAP SMEAR, LGSIL, ABNORMAL  . GASTROESOPHAGEAL REFLUX DISEASE  . Pregnancy, supervision, normal, first    Pediatrician: discussed. Infant feeding: plans to breastfeed. Cigarette smoking: never smoked. Follow up in 1 Week.

## 2010-10-24 NOTE — Patient Instructions (Signed)
I'm sorry you've been feeling sick!  Make sure you are drinking plenty of fluids to help with the contractions and to keep you and baby hydrated. Please go to the MAU at [facility] if you start having contractions every 5-7 minutes that are regular and last for 1-2 hours, if your water breaks, if baby isn't moving, or if you start having vaginal bleeding. Make sure you let your nurse know to page me if you are admitted to [facility] in labor!  Come back to clinic in 1 week.  Normal Labor and Delivery (First Baby) Your caregiver must first be sure you are in labor. Signs of labor include:  You may pass what is called "the mucus plug" before labor begins. This is a small amount of blood stained mucus.   Regular uterine contractions.   The time between contractions get closer together.   The discomfort and pain gradually gets more intense.   Pains are mostly located in the back.   Pains get worse when walking.   The cervix (the opening of the uterus becomes thinner (begins to efface) and opens up (dilates).  Once you are in labor and admitted into the hospital or care center, your caregiver will do the following:  A complete physical examination.   Check your vital signs (blood pressure, pulse, temperature and the fetal heart rate).   Do a vaginal examination (using a sterile glove and lubricant) to determine:   The position (presentation) of the baby (head [vertex] or buttock first).   The level (station) of the baby's head in the birth canal.   The effacement and dilatation of the cervix.   You may have your pubic hair shaved and be given an enema depending on your caregiver and the circumstance.   An electronic monitor is usually placed on your abdomen. The monitor follows the length and intensity of the contractions, as well as the baby's heart rate.   Usually, your caregiver will insert an IV in your arm with a bottle of sugar water. This is done as a precaution so that  medications can be given to you quickly during labor or delivery.  NORMAL LABOR AND DELIVERY IS DIVIDED UP INTO THREE STAGES: First Stage This is when regular contractions begin and the cervix begins to efface and dilate. This stage can last from 3 to 15 hours. The end of the first stage is when the cervix is 100% effaced and 10 centimeters dilated. Pain medications may be given by   Injection (morphine, demerol, etc.)   Regional anesthesia (spinal, caudal or epidural, anesthetics given in different locations of the spine). Paracervical pain medication may be given, which is an injection of and anesthetic (novocaine or xylocaine) on each side of the cervix.  A pregnant woman may request to have "Natural Childbirth" which is not to have any medications or anesthesia during her labor and delivery. Second Stage This is when the baby comes down through the birth canal (vagina) and is born. This can take 1 to 4 hours. As the baby's head comes down through the birth canal, you may feel like you are going to have a bowel movement. You will get the urge to bear down and push until the baby is delivered. As the baby's head is being delivered, the caregiver will decide if an episiotomy (a cut in the perineum and vagina area) is needed to prevent tearing of the tissue in this area. The episiotomy is sewn up after the delivery of the baby  and placenta. Sometimes a mask with nitrous oxide is given for the mother to breath during the delivery of the baby to help if there is too much pain. The end of Stage 2 is when the baby is fully delivered. Then when the umbilical cord stops pulsating it is clamped and cut. Third Stage The third stage begins after the baby is completely delivered and ends after the placenta (afterbirth) is delivered. This usually takes 5 to 30 minutes. After the placenta is delivered, a medication is given either by intravenous or injection to help contract the uterus and prevent bleeding. The  third stage is not painful and pain medication is usually not necessary. If an episiotomy was done, it is repaired at this time. After the delivery, the mother is watched and monitored closely for 1 to 2 hours to make sure there is no postpartum bleeding (hemorrhage). If there is a lot of bleeding, medication is given to contract the uterus and stop the bleeding.

## 2010-10-26 LAB — STREP B DNA PROBE: GBSP: NEGATIVE

## 2010-10-29 ENCOUNTER — Inpatient Hospital Stay (HOSPITAL_COMMUNITY)
Admission: AD | Admit: 2010-10-29 | Discharge: 2010-10-30 | Disposition: A | Discharge status: Home / Self Care | Payer: Medicaid Other | Source: Ambulatory Visit | Attending: Obstetrics and Gynecology | Admitting: Obstetrics and Gynecology

## 2010-10-29 DIAGNOSIS — O479 False labor, unspecified: Secondary | ICD-10-CM | POA: Insufficient documentation

## 2010-10-30 ENCOUNTER — Inpatient Hospital Stay (HOSPITAL_COMMUNITY)
Admission: AD | Admit: 2010-10-30 | Discharge: 2010-11-01 | DRG: 775 | Disposition: A | Discharge status: Home / Self Care | Payer: Medicaid Other | Source: Ambulatory Visit | Attending: Obstetrics and Gynecology | Admitting: Obstetrics and Gynecology

## 2010-10-30 DIAGNOSIS — O328XX0 Maternal care for other malpresentation of fetus, not applicable or unspecified: Secondary | ICD-10-CM

## 2010-10-30 LAB — CBC
HCT: 37.6 % (ref 36.0–46.0)
Hemoglobin: 12.8 g/dL (ref 12.0–15.0)
MCHC: 34 g/dL (ref 30.0–36.0)
MCV: 93.5 fL (ref 78.0–100.0)
Platelets: 261 10*3/uL (ref 150–400)
RDW: 13.3 % (ref 11.5–15.5)

## 2010-10-30 LAB — RPR: RPR Ser Ql: NONREACTIVE

## 2010-10-31 ENCOUNTER — Encounter: Payer: Medicaid Other | Admitting: Family Medicine

## 2010-10-31 LAB — CBC
Platelets: 255 10*3/uL (ref 150–400)
RDW: 13.6 % (ref 11.5–15.5)

## 2010-11-02 ENCOUNTER — Encounter: Payer: Self-pay | Admitting: Family Medicine

## 2010-12-31 ENCOUNTER — Encounter: Payer: Self-pay | Admitting: Family Medicine

## 2010-12-31 ENCOUNTER — Ambulatory Visit (INDEPENDENT_AMBULATORY_CARE_PROVIDER_SITE_OTHER): Payer: Medicaid Other | Admitting: Family Medicine

## 2010-12-31 VITALS — BP 121/79 | HR 81 | Temp 98.3°F | Wt 128.0 lb

## 2010-12-31 DIAGNOSIS — R51 Headache: Secondary | ICD-10-CM | POA: Insufficient documentation

## 2010-12-31 DIAGNOSIS — R519 Headache, unspecified: Secondary | ICD-10-CM | POA: Insufficient documentation

## 2010-12-31 DIAGNOSIS — F329 Major depressive disorder, single episode, unspecified: Secondary | ICD-10-CM

## 2010-12-31 DIAGNOSIS — Z3400 Encounter for supervision of normal first pregnancy, unspecified trimester: Secondary | ICD-10-CM

## 2010-12-31 MED ORDER — NORGESTIMATE-ETH ESTRADIOL 0.25-35 MG-MCG PO TABS: 1.0000 | ORAL_TABLET | Freq: Every day | ORAL | Status: DC

## 2010-12-31 NOTE — Assessment & Plan Note (Signed)
PHQ-9 score 6; pt seems appropriate, no depressed mood or feelings.

## 2010-12-31 NOTE — Assessment & Plan Note (Addendum)
Mom here today for 6wk PP f/u. Doing well.  Has had period. Would like to start Sprintec for birth control Only concern is headache (see problem for details)

## 2010-12-31 NOTE — Patient Instructions (Addendum)
You and baby look great!  She is growing wonderfully which means you are doing an excellent job! You can give her Tylenol every 6 hours as needed for fussiness.  She is 11lbs, 4oz, so use the dosing chart on the Tylenol box to measure out how much she should get.  She will likely get a little fever over the next day from the shots, which is expected.  As long as she's eating relatively well and still having wet diapers, it is all part of what we know will happen.   I'm sending a prescription for Sprintec in to [facility] for your birth control.  Make sure you take it the same time every day to prevent pregnancy, and I would use condoms for the first month or two just to be safe. Hopefully the birth control will also help with your headaches.  Keep drinking lots of fluids and taking your prenatal vitamin.  You can alternate tylenol and motrin as needed to help.  Sometimes a cool washcloth on your forehead or neck can help too.  Or, since there is likely a tension component, you could go get a massage! Give the birth control a good month or two before deciding if it's helping, and come back and see me if you start having worsening headaches, if they start waking you up at night, or you have blurry vision, dizziness, leg swelling, shortness of breath, or chest pain with the headache.  I will see baby in 2 months for her 4 months well child visit (and more shots).  Please make an appointment with me if you need to be seen sooner!

## 2010-12-31 NOTE — Assessment & Plan Note (Signed)
Hormonal vs migraine vs tension vs anemia vs combination -pt w/ h/o HA's before pregnancy, somewhat improved w/ pregnancy, now worsened -HA daily, severity waxes and wanes, no aura, no photo/phonophobia -most likely hormonal -will start sprintec for birth control and headache -given red flags to return for; no PIH or preE -will see her in 2 months at infant [facility], told pt to come back sooner if problems

## 2010-12-31 NOTE — Progress Notes (Signed)
S: Pt comes in today for 6wk post-partum visit.  Delivered on 10/30/10, mild lacs but no repairs needed, no post-partum hemorrhage, no PIH or other complications. Mom is doing well with the exception of headaches. Was having difficulty breast feeding so now is bottle-only.  Would like to start the pill for birth control. No vaginal bleeding; has already had a period.  Minimal pain with intercourse.  Things are going well at home.   Headache: Pt with headaches before and during pregnancy. Headaches can last all day; longest period of time since delivery without a headache is 3-4 hours.  Most concentrated in her temples and middle of her forehead, but also has pressure in a headband-like distribution. Caffeine sometimes makes it a little better, but nothing makes it go away entirely.  She has been taking tylenol, motrin and Excedrin migraine without any relief.  The severity waxes and wanes; usually she is functional but at times, when headache becomes severe she needs to lay down, which is very difficult with an infant at home and her boyfriend working during the day.  When HA's are severe, also has associate nausea and white dots/lines in her vision. No SOB, CP, LE edema associated. Pt did have a recent cold, but no longer having rhinorrhea; still having cough, no fevers, no congestion. No aura prior to onset of headache. Fatigued but no PICA-like cravings.  O: VSS Gen: NAD, good interaction with baby and boyfriend HEENT: MMM, EOMI, PERRLA, no papillary edema on fundoscopic exam, no sinus TTP, no rhinorrhea, pharynx non-erythematous, no drainage or exudate, no cervical LAD CV: RRR, no murmur Pulm: CTA B GI: soft, NT, ND, +BS GU: no external abnormalities on exam, bimanual exam w/ nontender, firm uterus, back to pre-pregnancy size.  Ext: WWP, no edema   A/P: 25 yo G1P1 here for 6wk PPV -mom and baby doing well, no concerns from a social standpoint, appropriate interactions -PHQ-9 score: 6, somewhat  difficult (3 for feeling tired/no energy) -recommended continuing PNV for fatigue and headache in case anemia is playing a role -starting on Sprintec for birth control -headache likely hormonal, so should see improvement in 1-2 months on OCPs, if no improvement or worsening, could explore if this is migraine vs tension vs anemia

## 2010-12-31 NOTE — Progress Notes (Deleted)
  Subjective:    Patient ID: Nichole Hampton, female    DOB: [birth year], 25 y.o.   MRN: [id number]  HPI Comments: Pt comes in today for 6wk post-partum visit.  Delivered on      Review of Systems     Objective:   Physical Exam        Assessment & Plan:

## 2011-03-09 ENCOUNTER — Ambulatory Visit (INDEPENDENT_AMBULATORY_CARE_PROVIDER_SITE_OTHER): Payer: Self-pay | Admitting: Family Medicine

## 2011-03-09 ENCOUNTER — Telehealth: Payer: Self-pay | Admitting: Family Medicine

## 2011-03-09 ENCOUNTER — Encounter: Payer: Self-pay | Admitting: Family Medicine

## 2011-03-09 VITALS — BP 115/73 | HR 66 | Ht 62.0 in | Wt 130.0 lb

## 2011-03-09 DIAGNOSIS — R51 Headache: Secondary | ICD-10-CM

## 2011-03-09 MED ORDER — SUMATRIPTAN SUCCINATE 50 MG PO TABS: 50.0000 mg | ORAL_TABLET | ORAL | Status: DC | PRN

## 2011-03-09 MED ORDER — KETOROLAC TROMETHAMINE 10 MG PO TABS: 10.0000 mg | ORAL_TABLET | Freq: Four times a day (QID) | ORAL | Status: AC | PRN

## 2011-03-09 MED ORDER — TRAMADOL HCL 50 MG PO TABS: 50.0000 mg | ORAL_TABLET | Freq: Four times a day (QID) | ORAL | Status: DC | PRN

## 2011-03-09 MED ORDER — KETOROLAC TROMETHAMINE 30 MG/ML IJ SOLN
30.0000 mg | Freq: Once | INTRAMUSCULAR | Status: AC
  Administered 2011-03-09: 30 mg via INTRAMUSCULAR

## 2011-03-09 MED ORDER — PROMETHAZINE HCL 12.5 MG PO TABS: 12.5000 mg | ORAL_TABLET | Freq: Four times a day (QID) | ORAL | Status: DC | PRN

## 2011-03-09 MED ORDER — SUMATRIPTAN SUCCINATE 6 MG/0.5ML ~~LOC~~ SOLN
6.0000 mg | Freq: Once | SUBCUTANEOUS | Status: AC
  Administered 2011-03-09: 6 mg via SUBCUTANEOUS
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
## 2011-03-09 NOTE — Telephone Encounter (Signed)
Will route to Dr. Louanne Belton who saw her this morning.

## 2011-03-09 NOTE — Telephone Encounter (Signed)
Was in this morning and was prescribed Imetrex and cannot afford that mediction.  Is there something else that she can instead.  Please give her a call back.

## 2011-03-09 NOTE — Patient Instructions (Signed)
It was good to see you today! I have sent in prescriptions for Imitrex and Phenergan. If you continue to have problems with headaches, please come back to see Nichole Hampton. I would recommend getting in to see Dr. Fara Boros sometime in the next 2-3 weeks.

## 2011-03-09 NOTE — Telephone Encounter (Signed)
Ms. Bridge has called back because she hasn't heard anything about a different medication.  The shot she was given this morning is wearing off and she really needs to get a Rx that she can afford.

## 2011-03-09 NOTE — Telephone Encounter (Signed)
Price for Imitrex was $300, which she is unable to afford.  Will send in for ketoralac and tramadol.  Emphasized that the latter is a temporary med only, not for long-term use, and the she should likely come back in to clinic in the next several days if the headache is still causing problems.  If this is the case, we may want to consider starting prophylactic medication on her.

## 2011-03-16 NOTE — Assessment & Plan Note (Signed)
Will give injection of toradol and imitrex and monitor for relief.  Will also give the patient a prescription for imitrex.  If she continues to have problems with headaches once her current one is broken, she would likely be a candidate for prophylactic therapy.

## 2011-05-11 LAB — POCT RAPID STREP A: Streptococcus, Group A Screen (Direct): NEGATIVE

## 2011-05-26 ENCOUNTER — Encounter (HOSPITAL_COMMUNITY): Payer: Self-pay | Admitting: *Deleted

## 2011-06-19 ENCOUNTER — Ambulatory Visit (INDEPENDENT_AMBULATORY_CARE_PROVIDER_SITE_OTHER): Payer: Self-pay | Admitting: Family Medicine

## 2011-06-19 ENCOUNTER — Encounter: Payer: Self-pay | Admitting: Family Medicine

## 2011-06-19 VITALS — BP 139/86 | HR 76 | Ht 62.0 in | Wt 131.6 lb

## 2011-06-19 DIAGNOSIS — Z309 Encounter for contraceptive management, unspecified: Secondary | ICD-10-CM | POA: Insufficient documentation

## 2011-06-19 DIAGNOSIS — R51 Headache: Secondary | ICD-10-CM

## 2011-06-19 MED ORDER — KETOROLAC TROMETHAMINE 60 MG/2ML IJ SOLN: 60.0000 mg | Freq: Once | INTRAMUSCULAR | Status: DC

## 2011-06-19 MED ORDER — NORGESTIMATE-ETH ESTRADIOL 0.25-35 MG-MCG PO TABS: 1.0000 | ORAL_TABLET | Freq: Every day | ORAL | Status: DC

## 2011-06-19 MED ORDER — KETOROLAC TROMETHAMINE 30 MG/ML IJ SOLN
30.0000 mg | Freq: Once | INTRAMUSCULAR | Status: AC
  Administered 2011-06-19: 30 mg via INTRAMUSCULAR

## 2011-06-19 MED ORDER — NAPROXEN 500 MG PO TABS: 500.0000 mg | ORAL_TABLET | Freq: Two times a day (BID) | ORAL | Status: DC

## 2011-06-19 MED ORDER — SUMATRIPTAN SUCCINATE 6 MG/0.5ML ~~LOC~~ SOLN
6.0000 mg | Freq: Once | SUBCUTANEOUS | Status: AC
  Administered 2011-06-19: 6 mg via SUBCUTANEOUS

## 2011-06-19 MED ORDER — KETOROLAC TROMETHAMINE 30 MG/ML IJ SOLN: 30.0000 mg | Freq: Once | INTRAMUSCULAR | Status: DC

## 2011-06-19 MED ORDER — AMITRIPTYLINE HCL 25 MG PO TABS: 25.0000 mg | ORAL_TABLET | Freq: Every day | ORAL | Status: DC

## 2011-06-19 NOTE — Patient Instructions (Addendum)
Use amitrptyline daily to help prevent headaches  Use naproxen as needed  Try to wean down all pain medicines and caffeine to less than twice per week  See headache log  Make follow-up in 1 month and bring log

## 2011-06-19 NOTE — Assessment & Plan Note (Signed)
Will treat today's acute migraine with Toradol and sumatriptan subcutaneous.  We discussed the effect of medication overuse rebound headache. Will start a preventative medication. We have shows and amitriptyline given that she has a history of depression although this is now resolved. This will also help with insomnia which may be contributing.  I have given her a handout on starting prophylactic migraine medication and instructions on keeping a migraine diary to look for triggers.  I have advised her that she may titrate up from 25 mg to 50 mg on her own if needed. She is to followup with her migraine diary and one month  She was advised that amitriptyline is category C. She is currently using condoms for protection and does not intend on becoming pregnant. I have refilled her oral contraception

## 2011-06-19 NOTE — Progress Notes (Signed)
  Subjective:    Patient ID: Nichole Hampton, female    DOB: Sep 29, 1985, 25 y.o.   MRN: [id number]  HPI 25 year old chronic migraine presents for a same-day appointment for migraine treatment .  Patient was seen for migraine in July of this year and was given Imitrex and Toradol in the office. She had good relief of her migraine. She was unable to afford sumatriptan and that was prescribed to her. She notes she continues to have bilateral throbbing headaches at her temples. Currently it as a 7/10. Sometimes it is unilateral.   She reports no vision changes numbness tingling or weakness.  She reports taking a large amount of over-the-counter medications as well as caffeine use. She reports headaches at least 15 days out of the month with headaches at least 3-4 times per week. They are not menstrual   Review of Systemsplease see history of present illness She reports no sadness, anxiety, stress. She does report some insomnia and difficulty sleeping. She has a 25 month old baby. She is not breast-feeding    I have reviewed patient's  PMH, FH, and Social history and Medications as related to this visit.  Objective:   Physical Exam GEN: Alert & Oriented, No acute distress CV:  Regular Rate & Rhythm, no murmur Respiratory:  Normal work of breathing, CTAB Abd:  + BS, soft, no tenderness to palpation Ext: no pre-tibial edema Neuro: EOMI< PERRLA, non focal        Assessment & Plan:

## 2011-06-19 NOTE — Progress Notes (Signed)
Addended by: Jimmy Footman K on: 06/19/2011 12:04 PM   Modules accepted: Orders

## 2011-06-19 NOTE — Assessment & Plan Note (Signed)
We discussed the importance of reliable contraception other than condoms. I will fill a prescription for Sprintec and advised her that this should only be $9 at [facility]. She should asked for the generic and if she has difficulty obtaining it at this price to contact me so that we can followup on making sure she is reliable birth control

## 2012-03-23 ENCOUNTER — Other Ambulatory Visit: Payer: Self-pay | Admitting: Family Medicine

## 2012-03-23 DIAGNOSIS — R51 Headache: Secondary | ICD-10-CM

## 2012-03-23 MED ORDER — SUMATRIPTAN SUCCINATE 50 MG PO TABS: 50.0000 mg | ORAL_TABLET | ORAL | Status: DC | PRN

## 2012-03-30 ENCOUNTER — Emergency Department (HOSPITAL_COMMUNITY)
Admission: EM | Admit: 2012-03-30 | Discharge: 2012-03-31 | Disposition: A | Discharge status: Home / Self Care | Payer: Self-pay | Attending: Emergency Medicine | Admitting: Emergency Medicine

## 2012-03-30 ENCOUNTER — Encounter (HOSPITAL_COMMUNITY): Payer: Self-pay | Admitting: Emergency Medicine

## 2012-03-30 DIAGNOSIS — K089 Disorder of teeth and supporting structures, unspecified: Secondary | ICD-10-CM | POA: Insufficient documentation

## 2012-03-30 NOTE — ED Notes (Signed)
Call pt to room x2 with no answer

## 2012-03-30 NOTE — ED Notes (Signed)
Pt c/o left lower dental pain x 2 days 

## 2012-04-08 ENCOUNTER — Telehealth: Payer: Self-pay | Admitting: Family Medicine

## 2012-04-08 NOTE — Telephone Encounter (Signed)
Called pt. Pt wanted to speak with Dr.McGill. I told the pt, that Dr.McGill has not see pt since 5/12 and she needs to schedule OV with her. Pt agreed. Fwd. To Dr.McGill for info. Lorenda Hatchet, Renato Battles

## 2012-04-08 NOTE — Telephone Encounter (Signed)
Patient is calling wishing to discuss some of the medications that were prescribed.

## 2012-04-08 NOTE — Telephone Encounter (Signed)
Last ov?

## 2013-07-20 ENCOUNTER — Other Ambulatory Visit: Payer: Self-pay | Admitting: Family Medicine

## 2013-07-21 NOTE — Telephone Encounter (Signed)
Patient will need to be seen in clinic before further refills will be given

## 2013-10-04 ENCOUNTER — Other Ambulatory Visit: Payer: Self-pay | Admitting: *Deleted

## 2013-10-04 MED ORDER — SUMATRIPTAN SUCCINATE 50 MG PO TABS: ORAL_TABLET | ORAL | Status: DC

## 2013-10-04 NOTE — Telephone Encounter (Signed)
Pt in clinic requesting Rx for migraine to be filled. Previous Rx expired.  Clovis PuMartin, Bernhard Koskinen L, RN

## 2014-02-12 ENCOUNTER — Ambulatory Visit: Payer: Self-pay | Admitting: Family Medicine

## 2014-03-28 ENCOUNTER — Other Ambulatory Visit: Payer: Self-pay | Admitting: Family Medicine

## 2014-03-28 MED ORDER — SUMATRIPTAN SUCCINATE 50 MG PO TABS: 50.0000 mg | ORAL_TABLET | ORAL | Status: DC | PRN

## 2014-06-11 ENCOUNTER — Encounter (HOSPITAL_COMMUNITY): Payer: Self-pay | Admitting: Emergency Medicine

## 2018-08-10 NOTE — L&D Delivery Note (Signed)
Delivery Note Anesthesia: 1% lidocaine (12 cc) Episiotomy: None Lacerations: 1st degree;Perineal Suture Repair: 3.0 vicryl rapide Est. Blood Loss (mL): 46  Findings: Female infant, APGAR: 9, 9; weight 6 lb 2.9 oz (2805 g).    At 7:10 PM, this 33 yo G5 now P2032 delivered a viable female infant via spontaneous vaginal delivery without epidural anesthesia 12 minutes after SROM with clear fluids. Baby delivered straight occiput anterior and restituted LOT. There was no nuchal. The anterior shoulder delivered with ease, followed by the corpus. The infant was placed directly on mother's chest. After 1 minute, the cord was doubly clamped and cut by the FOB under my direct supervision.   The placenta was delivered via Delena Bali mechanism and appeared to intact with a 3-vessel cord. IV Pitocin was started and the uterus was firm and at the umbilicus.  The vagina and perineum were inspected, and she was noted to have bilateral labial tears and a first degree perineal laceration. One labial tear and the perineal laceration were repaired with 3-0 Vicryl Rapide in the usual fashion.  All sponge, sharp, and instrument counts were correct.  Mom to postpartum.  Baby to Couplet care / Skin to Skin.  Nichole Hampton L Nichole Hampton 03/22/2019, 9:05 PM

## 2018-08-11 ENCOUNTER — Ambulatory Visit: Payer: Self-pay | Admitting: General Practice

## 2018-08-11 DIAGNOSIS — N96 Recurrent pregnancy loss: Secondary | ICD-10-CM

## 2018-08-11 DIAGNOSIS — Z3201 Encounter for pregnancy test, result positive: Secondary | ICD-10-CM

## 2018-08-11 DIAGNOSIS — O3680X0 Pregnancy with inconclusive fetal viability, not applicable or unspecified: Secondary | ICD-10-CM

## 2018-08-11 LAB — POCT PREGNANCY, URINE: Preg Test, Ur: POSITIVE — AB

## 2018-08-11 NOTE — Progress Notes (Signed)
Patient presents to office today for UPT. UPT +. Patient reports first positive home test right after Thanksgiving. LMP 05/22/18 EDD 02/26/19 [redacted]w[redacted]d. States she had a day or two of spotting in November. Patient denies taking any meds/vitamins- plans to get PNV today. Patient reports concern due to history of multiple miscarriages and wants to make sure everything is okay. Patient reports occasional off/on cramp on one side over the past week. Attempted heart tones, but was unable to get. Will schedule ultrasound for dating/viability. Scheduled 1/8 @ 3pm & informed patient. Patient verbalized understanding to all & had no questions.  Chase Caller RN BSN 08/11/18

## 2018-08-15 NOTE — Progress Notes (Signed)
I agree with the nurses note and plan of care.    Duane Lope, NP 08/15/2018 9:42 AM

## 2018-08-17 ENCOUNTER — Ambulatory Visit (INDEPENDENT_AMBULATORY_CARE_PROVIDER_SITE_OTHER): Payer: Self-pay | Admitting: General Practice

## 2018-08-17 ENCOUNTER — Ambulatory Visit (HOSPITAL_COMMUNITY)
Admission: RE | Admit: 2018-08-17 | Discharge: 2018-08-17 | Disposition: A | Discharge status: Home / Self Care | Payer: Self-pay | Source: Ambulatory Visit | Attending: Obstetrics and Gynecology | Admitting: Obstetrics and Gynecology

## 2018-08-17 DIAGNOSIS — Z712 Person consulting for explanation of examination or test findings: Secondary | ICD-10-CM

## 2018-08-17 DIAGNOSIS — O3680X0 Pregnancy with inconclusive fetal viability, not applicable or unspecified: Secondary | ICD-10-CM | POA: Insufficient documentation

## 2018-08-17 DIAGNOSIS — N96 Recurrent pregnancy loss: Secondary | ICD-10-CM | POA: Insufficient documentation

## 2018-08-17 NOTE — Progress Notes (Signed)
Patient presents to office today for viability ultrasound results. Reviewed with Dr Vergie Living who finds living IUP- patient should begin prenatal care.   Informed patient of results, reviewed dating, & provided pictures. Recommended she begin OB care. Patient verbalized understanding & had no questions.  Chase Caller RN BSN 08/17/18

## 2018-08-19 NOTE — Progress Notes (Signed)
I have reviewed the chart and agree with nursing staff's documentation of this patient's encounter.   Bing, MD 08/19/2018 3:33 PM

## 2018-09-08 ENCOUNTER — Encounter: Payer: Self-pay | Admitting: Advanced Practice Midwife

## 2018-09-08 ENCOUNTER — Ambulatory Visit (INDEPENDENT_AMBULATORY_CARE_PROVIDER_SITE_OTHER): Payer: Self-pay | Admitting: Advanced Practice Midwife

## 2018-09-08 ENCOUNTER — Ambulatory Visit: Payer: Self-pay | Admitting: Clinical

## 2018-09-08 VITALS — BP 130/83 | HR 81 | Wt 146.4 lb

## 2018-09-08 DIAGNOSIS — Z23 Encounter for immunization: Secondary | ICD-10-CM

## 2018-09-08 DIAGNOSIS — Z3A18 18 weeks gestation of pregnancy: Secondary | ICD-10-CM

## 2018-09-08 DIAGNOSIS — O219 Vomiting of pregnancy, unspecified: Secondary | ICD-10-CM

## 2018-09-08 DIAGNOSIS — Z658 Other specified problems related to psychosocial circumstances: Secondary | ICD-10-CM

## 2018-09-08 DIAGNOSIS — N898 Other specified noninflammatory disorders of vagina: Secondary | ICD-10-CM

## 2018-09-08 DIAGNOSIS — O0991 Supervision of high risk pregnancy, unspecified, first trimester: Secondary | ICD-10-CM

## 2018-09-08 DIAGNOSIS — O09899 Supervision of other high risk pregnancies, unspecified trimester: Secondary | ICD-10-CM | POA: Insufficient documentation

## 2018-09-08 DIAGNOSIS — Z124 Encounter for screening for malignant neoplasm of cervix: Secondary | ICD-10-CM

## 2018-09-08 DIAGNOSIS — B9689 Other specified bacterial agents as the cause of diseases classified elsewhere: Secondary | ICD-10-CM

## 2018-09-08 DIAGNOSIS — Z3480 Encounter for supervision of other normal pregnancy, unspecified trimester: Secondary | ICD-10-CM

## 2018-09-08 DIAGNOSIS — N760 Acute vaginitis: Secondary | ICD-10-CM

## 2018-09-08 DIAGNOSIS — N96 Recurrent pregnancy loss: Secondary | ICD-10-CM

## 2018-09-08 DIAGNOSIS — Z363 Encounter for antenatal screening for malformations: Secondary | ICD-10-CM

## 2018-09-08 DIAGNOSIS — R87612 Low grade squamous intraepithelial lesion on cytologic smear of cervix (LGSIL): Secondary | ICD-10-CM

## 2018-09-08 DIAGNOSIS — Z1151 Encounter for screening for human papillomavirus (HPV): Secondary | ICD-10-CM

## 2018-09-08 DIAGNOSIS — Z113 Encounter for screening for infections with a predominantly sexual mode of transmission: Secondary | ICD-10-CM

## 2018-09-08 DIAGNOSIS — O0990 Supervision of high risk pregnancy, unspecified, unspecified trimester: Secondary | ICD-10-CM

## 2018-09-08 HISTORY — DX: Supervision of other high risk pregnancies, unspecified trimester: O09.899

## 2018-09-08 MED ORDER — PROMETHAZINE HCL 25 MG PO TABS: 25.0000 mg | ORAL_TABLET | Freq: Four times a day (QID) | ORAL | 2 refills | Status: DC | PRN

## 2018-09-08 NOTE — Progress Notes (Signed)
Subjective:    Nichole Hampton is being seen today for her first obstetrical visit.  This is not a planned pregnancy, but is excited. She is at [redacted]w[redacted]d gestation by 6 week [place]. Her obstetrical history is significant for recurrent miscarriage . Relationship with FOB: spouse, living together. Patient does intend to breast feed. Pregnancy history fully reviewed.  Patient reports nausea and vomiting.  Review of Systems:   Review of Systems  Constitutional: Negative for appetite change, chills and fever.  Gastrointestinal: Positive for nausea and vomiting. Negative for abdominal pain, constipation and diarrhea.  Genitourinary: Negative for dysuria, pelvic pain, vaginal bleeding and vaginal discharge.    Objective:     BP 130/83   Pulse 81   Wt 146 lb 6.4 oz (66.4 kg)   BMI 26.78 kg/m  Physical Exam  Constitutional: She is oriented to person, place, and time. She appears well-developed and well-nourished. No distress.  HENT:  Mouth/Throat: Dental caries present.  Eyes: No scleral icterus.  Neck: No thyromegaly present.  Cardiovascular: Normal rate and regular rhythm.  Respiratory: Effort normal and breath sounds normal.  GI: Soft. She exhibits no distension. There is no abdominal tenderness.  Genitourinary:    Vulva and vagina normal.  There is no lesion on the right labia. There is no lesion on the left labia. Uterus is enlarged. Uterus is not tender. Cervix exhibits no motion tenderness, no discharge and no friability. Right adnexum displays no mass, no tenderness and no fullness. Left adnexum displays no mass, no tenderness and no fullness.    No vaginal discharge or bleeding.  No bleeding in the vagina.  Musculoskeletal:        General: No tenderness or edema.  Neurological: She is alert and oriented to person, place, and time. She has normal reflexes.  Skin: Skin is warm and dry.  Psychiatric: She has a normal mood and affect.    Maternal Exam:  Abdomen: Patient reports no  abdominal tenderness. Introitus: Normal vulva. Normal vagina.  Vagina is negative for discharge.  Pelvis: adequate for delivery.   Cervix: Cervix evaluated by sterile speculum exam and digital exam.     Fetal Exam Fetal Monitor Review: Mode: hand-held doppler probe.   Baseline rate: 175.         Assessment:    Pregnancy: [id number] Patient Active Problem List   Diagnosis Date Noted  . Supervision of other normal pregnancy, antepartum 09/08/2018  . Supervision of high risk pregnancy, antepartum 09/08/2018  . History of recurrent miscarriages 09/08/2018  . Contraception management 06/19/2011  . Headache(784.0) 12/31/2010  . GASTROESOPHAGEAL REFLUX DISEASE 08/28/2010  . Low grade squamous intraepithelial lesion (LGSIL) on cervical Pap smear 04/21/2010  . DEPRESSIVE DISORDER, NOS 10/07/2006       Plan:     1. Supervision of high risk pregnancy, antepartum  - Plans BTL. Consent at 28 weeks - [facility] AMB BABYSCRIPTS OPT IN - Culture, OB Urine - Flu Vaccine QUAD 36+ mos IM - Hemoglobinopathy Evaluation - Obstetric Panel, Including HIV - SMN1 COPY NUMBER ANALYSIS (SMA Carrier Screen) - Cytology - PAP( [facility])  2. N/V - B6, Unisom - Phenergan  3. Hx LGSIL Pap - Pap today  Initial labs drawn. Prenatal vitamins. Problem list reviewed and updated. AFP3 discussed: requested. Panorama in 2 weeks.  Role of ultrasound in pregnancy discussed; fetal survey: ordered. Amniocentesis discussed: not indicated. Follow up in 4 weeks. Discussed clinic routines, schedule of care and testing, genetic screening options, involvement of students and residents  under the direct supervision of APPs and doctors and presence of female providers. Pt verbalized understanding.   Dorathy KinsmanVirginia Chatara Lucente 09/08/2018

## 2018-09-08 NOTE — BH Specialist Note (Signed)
Integrated Behavioral Health Initial Visit  MRN: [id number] Name: BRIONY MUCHOW  Number of Integrated Behavioral Health Clinician visits:: 1/6 Session Start time: 10:30 Session End time: 10:45 Total time: 15 minutes  Type of Service: Integrated Behavioral Health- Individual/Family Interpretor:No. Interpretor Name and Language: n/a   Warm Hand Off Completed.       SUBJECTIVE: AVIYA TONN is a 33 y.o. female accompanied by n/a Patient was referred by Dorathy Kinsman, CNM for Initial OB introduction to integrated behavioral health services . Patient reports the following symptoms/concerns: Pt states mild life stress today; no other concerns.  Duration of problem: N/A; Severity of problem: mild  OBJECTIVE: Mood: Normal and Affect: Appropriate Risk of harm to self or others: No plan to harm self or others  LIFE CONTEXT: Family and Social: Pt lives with FOB and 8yo daughter; Pt cares for mother (who lives with pt's sister) locally School/Work: - Self-Care: - Life Changes: Current pregnancy; pt's mother moved out after recent health issues (pt still caring for mother on a routine basis)  GOALS ADDRESSED: Patient will: 1. Increase knowledge and/or ability of: healthy habits  2. Demonstrate ability to: Increase healthy adjustment to current life circumstances and Increase adequate support systems for patient/family  INTERVENTIONS: Interventions utilized: Psychoeducation and/or Health Education and Link to [facility]  Standardized Assessments completed: Not given today  ASSESSMENT: Patient currently experiencing: Supervision of other normal pregnancy, antepartum and Psychosocial stress.    Patient may benefit from Initial OB introduction to integrated behavioral health services And link to community services.  PLAN: 1. Follow up with behavioral health clinician on : As needed 2. Behavioral recommendations:  -Continue taking prenatal vitamin, as recommended by  medical provider -Consider community resources discussed, as needed 3. Referral(s): Integrated Art gallery manager (In Clinic) and [employer]:  Transportation and [employer] 4. "From scale of 1-10, how likely are you to follow plan?": 10   C , LCSW

## 2018-09-08 NOTE — Patient Instructions (Addendum)
Safe Medications in Pregnancy   Acne: Benzoyl Peroxide Salicylic Acid  Backache/Headache: Tylenol: 2 regular strength every 4 hours OR              2 Extra strength every 6 hours  Colds/Coughs/Allergies: Benadryl (alcohol free) 25 mg every 6 hours as needed Breath right strips Claritin Cepacol throat lozenges Chloraseptic throat spray Cold-Eeze- up to three times per day Cough drops, alcohol free Flonase (by prescription only) Guaifenesin Mucinex Robitussin DM (plain only, alcohol free) Saline nasal spray/drops Sudafed (pseudoephedrine) & Actifed ** use only after [redacted] weeks gestation and if you do not have high blood pressure Tylenol Vicks Vaporub Zinc lozenges Zyrtec   Constipation: Colace Ducolax suppositories Fleet enema Glycerin suppositories Metamucil Milk of magnesia Miralax Senokot Smooth move tea  Diarrhea: Kaopectate Imodium A-D  *NO pepto Bismol  Hemorrhoids: Anusol Anusol HC Preparation H Tucks  Indigestion: Tums Maalox Mylanta Zantac  Pepcid  Insomnia: Benadryl (alcohol free) 25mg  every 6 hours as needed Tylenol PM Unisom, no Gelcaps  Leg Cramps: Tums MagGel  Nausea/Vomiting:  Bonine Dramamine Emetrol Ginger extract Sea bands Meclizine  Nausea medication to take during pregnancy:  Unisom (doxylamine succinate 25 mg tablets) Take one tablet daily at bedtime. If symptoms are not adequately controlled, the dose can be increased to a maximum recommended dose of two tablets daily (1/2 tablet in the morning, 1/2 tablet mid-afternoon and one at bedtime). Vitamin B6 100mg  tablets. Take one tablet twice a day (up to 200 mg per day).  Skin Rashes: Aveeno products Benadryl cream or 25mg  every 6 hours as needed Calamine Lotion 1% cortisone cream  Yeast infection: Gyne-lotrimin 7 Monistat 7   **If taking multiple medications, please check labels to avoid duplicating the same active ingredients **take medication as directed on  the label ** Do not exceed 4000 mg of tylenol in 24 hours **Do not take medications that contain aspirin or ibuprofen      Body Ringworm Body ringworm is an infection of the skin that often causes a ring-shaped rash. Body ringworm can affect any part of your skin. It can spread easily to others. Body ringworm is also called tinea corporis. What are the causes? This condition is caused by funguses called dermatophytes. The condition develops when these funguses grow out of control on the skin. You can get this condition if you touch a person or animal that has it. You can also get it if you share clothing, bedding, towels, or any other object with an infected person or pet. What increases the risk? This condition is more likely to develop in:  Athletes who often make skin-to-skin contact with other athletes, such as wrestlers.  People who share equipment and mats.  People with a weakened immune system. What are the signs or symptoms? Symptoms of this condition include:  Itchy, raised red spots and bumps.  Red scaly patches.  A ring-shaped rash. The rash may have: ? A clear center. ? Scales or red bumps at its center. ? Redness near its borders. ? Dry and scaly skin on or around it. How is this diagnosed? This condition can usually be diagnosed with a skin exam. A skin scraping may be taken from the affected area and examined under a microscope to see if the fungus is present. How is this treated? This condition may be treated with:  An antifungal cream or ointment.  An antifungal shampoo. (Selenium Sulfide-Selsen Blue)  Antifungal medicines. These may be prescribed if your ringworm is severe, keeps coming  back, or lasts a long time. Follow these instructions at home:  Take over-the-counter and prescription medicines only as told by your health care provider.  If you were given an antifungal cream or ointment: ? Use it as told by your health care provider. ? Wash the  infected area and dry it completely before applying the cream or ointment.  If you were given an antifungal shampoo: ? Use it as told by your health care provider. ? Leave the shampoo on your body for 3-5 minutes before rinsing.  While you have a rash: ? Wear loose clothing to stop clothes from rubbing and irritating it. ? Wash or change your bed sheets every night.  If your pet has the same infection, take your pet to see a International aid/development workerveterinarian. How is this prevented?  Practice good hygiene.  Wear sandals or shoes in public places and showers.  Do not share personal items with others.  Avoid touching red patches of skin on other people.  Avoid touching pets that have bald spots.  If you touch an animal that has a bald spot, wash your hands. Contact a health care provider if:  Your rash continues to spread after 7 days of treatment.  Your rash is not gone in 4 weeks.  The area around your rash gets red, warm, tender, and swollen. This information is not intended to replace advice given to you by your health care provider. Make sure you discuss any questions you have with your health care provider. Document Released: 07/24/2000 Document Revised: 01/02/2016 Document Reviewed: 05/23/2015 Elsevier Interactive Patient Education  2019 [employer]

## 2018-09-09 ENCOUNTER — Encounter: Payer: Self-pay | Admitting: Advanced Practice Midwife

## 2018-09-10 LAB — URINE CULTURE, OB REFLEX

## 2018-09-10 LAB — CULTURE, OB URINE

## 2018-09-13 LAB — CYTOLOGY - PAP
Bacterial vaginitis: POSITIVE — AB
Candida vaginitis: NEGATIVE
Chlamydia: NEGATIVE
Diagnosis: NEGATIVE
HPV: NOT DETECTED
NEISSERIA GONORRHEA: NEGATIVE

## 2018-09-18 MED ORDER — METRONIDAZOLE 500 MG PO TABS: 500.0000 mg | ORAL_TABLET | Freq: Two times a day (BID) | ORAL | 0 refills | Status: DC

## 2018-09-18 NOTE — Addendum Note (Signed)
Addended by: Dorathy KinsmanSMITH, Nazario Russom on: 09/18/2018 10:25 PM   Modules accepted: Orders

## 2018-09-19 LAB — SMN1 COPY NUMBER ANALYSIS (SMA CARRIER SCREENING)

## 2018-09-19 LAB — OBSTETRIC PANEL, INCLUDING HIV
Antibody Screen: NEGATIVE
BASOS: 0 %
Basophils Absolute: 0 10*3/uL (ref 0.0–0.2)
EOS (ABSOLUTE): 0.1 10*3/uL (ref 0.0–0.4)
Eos: 1 %
HEMATOCRIT: 39 % (ref 34.0–46.6)
HIV SCREEN 4TH GENERATION: NONREACTIVE
Hemoglobin: 13 g/dL (ref 11.1–15.9)
Hepatitis B Surface Ag: NEGATIVE
Immature Grans (Abs): 0 10*3/uL (ref 0.0–0.1)
Immature Granulocytes: 0 %
LYMPHS ABS: 2.1 10*3/uL (ref 0.7–3.1)
Lymphs: 23 %
MCH: 31.5 pg (ref 26.6–33.0)
MCHC: 33.3 g/dL (ref 31.5–35.7)
MCV: 94 fL (ref 79–97)
Monocytes Absolute: 0.5 10*3/uL (ref 0.1–0.9)
Monocytes: 5 %
NEUTROS ABS: 6.8 10*3/uL (ref 1.4–7.0)
Neutrophils: 71 %
Platelets: 299 10*3/uL (ref 150–450)
RBC: 4.13 x10E6/uL (ref 3.77–5.28)
RDW: 12.4 % (ref 11.7–15.4)
RPR Ser Ql: NONREACTIVE
Rh Factor: POSITIVE
Rubella Antibodies, IGG: 7.66 index (ref >0.99)
WBC: 9.4 10*3/uL (ref 3.4–10.8)

## 2018-09-19 LAB — HEMOGLOBINOPATHY EVALUATION
Ferritin: 62 ng/mL (ref 15–150)
HGB A: 97.3 % (ref 96.4–98.8)
Hgb A2 Quant: 2.7 % (ref 1.8–3.2)
Hgb C: 0 %
Hgb F Quant: 0 % (ref 0.0–2.0)
Hgb S: 0 %
Hgb Solubility: NEGATIVE
Hgb Variant: 0 %

## 2018-09-20 ENCOUNTER — Encounter: Payer: Self-pay | Admitting: Advanced Practice Midwife

## 2018-09-22 ENCOUNTER — Other Ambulatory Visit: Payer: Self-pay

## 2018-09-22 DIAGNOSIS — O09899 Supervision of other high risk pregnancies, unspecified trimester: Secondary | ICD-10-CM

## 2018-09-22 DIAGNOSIS — N96 Recurrent pregnancy loss: Secondary | ICD-10-CM

## 2018-09-22 NOTE — Progress Notes (Signed)
Opened in error

## 2018-09-27 ENCOUNTER — Encounter: Payer: Self-pay | Admitting: *Deleted

## 2018-10-04 ENCOUNTER — Telehealth: Payer: Self-pay

## 2018-10-04 NOTE — Telephone Encounter (Signed)
Called patient no answer left a  Message stating her test results are not back yet. She will need to come in for appointment to get her results.

## 2018-10-04 NOTE — Telephone Encounter (Signed)
Pt is calling requesting to find out her Natera results.

## 2018-10-06 ENCOUNTER — Encounter: Payer: Self-pay | Admitting: Obstetrics and Gynecology

## 2018-10-07 ENCOUNTER — Encounter: Payer: Self-pay | Admitting: Obstetrics & Gynecology

## 2018-10-25 ENCOUNTER — Encounter: Payer: Self-pay | Admitting: *Deleted

## 2018-10-26 ENCOUNTER — Telehealth: Payer: Self-pay

## 2018-10-26 NOTE — Telephone Encounter (Signed)
Left voicemail on patients machine, about the procedure for check in/ and the regulations for the COVID -19 updated procedure for checking in, advised patient to call the office for any concerns.

## 2018-10-27 ENCOUNTER — Encounter: Payer: Self-pay | Admitting: Medical

## 2018-10-27 ENCOUNTER — Ambulatory Visit (INDEPENDENT_AMBULATORY_CARE_PROVIDER_SITE_OTHER): Payer: Self-pay | Admitting: Medical

## 2018-10-27 ENCOUNTER — Other Ambulatory Visit: Payer: Self-pay

## 2018-10-27 VITALS — BP 119/59 | HR 130 | Temp 98.4°F | Wt 146.3 lb

## 2018-10-27 DIAGNOSIS — O26892 Other specified pregnancy related conditions, second trimester: Secondary | ICD-10-CM

## 2018-10-27 DIAGNOSIS — Z3480 Encounter for supervision of other normal pregnancy, unspecified trimester: Secondary | ICD-10-CM

## 2018-10-27 DIAGNOSIS — O219 Vomiting of pregnancy, unspecified: Secondary | ICD-10-CM

## 2018-10-27 DIAGNOSIS — Z3A17 17 weeks gestation of pregnancy: Secondary | ICD-10-CM

## 2018-10-27 DIAGNOSIS — N949 Unspecified condition associated with female genital organs and menstrual cycle: Secondary | ICD-10-CM

## 2018-10-27 MED ORDER — COMFORT FIT MATERNITY SUPP LG MISC: 1.0000 [IU] | Freq: Every day | 0 refills | Status: DC | PRN

## 2018-10-27 MED ORDER — ONDANSETRON HCL 4 MG PO TABS: 4.0000 mg | ORAL_TABLET | Freq: Four times a day (QID) | ORAL | 0 refills | Status: DC

## 2018-10-27 NOTE — Progress Notes (Signed)
   PRENATAL VISIT NOTE  Subjective:  Nichole Hampton is a 33 y.o. [phone] at [redacted]w[redacted]d being seen today for ongoing prenatal care.  She is currently monitored for the following issues for this low-risk pregnancy and has DEPRESSIVE DISORDER, NOS; Low grade squamous intraepithelial lesion (LGSIL) on cervical Pap smear; GASTROESOPHAGEAL REFLUX DISEASE; Headache(784.0); Supervision of other high risk pregnancy, antepartum; and History of recurrent miscarriages on their problem list.  Patient reports decreased appetite, intermittent round ligament pain.  Contractions: Not present. Vag. Bleeding: None.  Movement: Present. Denies leaking of fluid.   The following portions of the patient's history were reviewed and updated as appropriate: allergies, current medications, past family history, past medical history, past social history, past surgical history and problem list.   Objective:   Vitals:   10/27/18 1021  BP: (!) 119/59  Pulse: (!) 130  Temp: 98.4 F (36.9 C)  Weight: 146 lb 4.8 oz (66.4 kg)    Fetal Status: Fetal Heart Rate (bpm): 159   Movement: Present     General:  Alert, oriented and cooperative. Patient is in no acute distress.  Skin: Skin is warm and dry. No rash noted.   Cardiovascular: Normal heart rate noted  Respiratory: Normal respiratory effort, no problems with respiration noted  Abdomen: Soft, gravid, appropriate for gestational age.  Pain/Pressure: Present     Pelvic: Cervical exam deferred        Extremities: Normal range of motion.  Edema: None  Mental Status: Normal mood and affect. Normal behavior. Normal judgment and thought content.   Assessment and Plan:  Pregnancy: G5P1031 at [redacted]w[redacted]d 1. Supervision of other normal pregnancy, antepartum - AFP, Serum, Open Spina Bifida - Encouraged patient to call ahead if experiencing respiratory symptoms with fever so that she can be triaged and tested/directed appropriately   2. Round ligament pain - Elastic Bandages & Supports  (COMFORT FIT MATERNITY SUPP LG) MISC; 1 Units by Does not apply route daily as needed.  Dispense: 1 each; Refill: 0  3. Nausea/vomiting in pregnancy - ondansetron (ZOFRAN) 4 MG tablet; Take 1 tablet (4 mg total) by mouth every 6 (six) hours.  Dispense: 20 tablet; Refill: 0 - Discussed Ensure for increased dietary protein   Preterm labor/ second trimester warning symptoms and general obstetric precautions including but not limited to vaginal bleeding, contractions, leaking of fluid and fetal movement were reviewed in detail with the patient. Please refer to After Visit Summary for other counseling recommendations.   Return in about 8 weeks (around 12/22/2018) for LOB.  Future Appointments  Date Time Provider Department Center  11/07/2018 10:45 AM [facility] NURSE [facility]  11/07/2018 10:45 AM [facility] [place] 2 [facility]    Vonzella Nipple, PA-C

## 2018-10-27 NOTE — Patient Instructions (Addendum)
Food Choices for Gastroesophageal Reflux Disease, Adult When you have gastroesophageal reflux disease (GERD), the foods you eat and your eating habits are very important. Choosing the right foods can help ease your discomfort. Think about working with a nutrition specialist (dietitian) to help you make good choices. What are tips for following this plan?  Meals  Choose healthy foods that are low in fat, such as fruits, vegetables, whole grains, low-fat dairy products, and lean meat, fish, and poultry.  Eat small meals often instead of 3 large meals a day. Eat your meals slowly, and in a place where you are relaxed. Avoid bending over or lying down until 2-3 hours after eating.  Avoid eating meals 2-3 hours before bed.  Avoid drinking a lot of liquid with meals.  Cook foods using methods other than frying. Bake, grill, or broil food instead.  Avoid or limit: ? Chocolate. ? Peppermint or spearmint. ? Alcohol. ? Pepper. ? Black and decaffeinated coffee. ? Black and decaffeinated tea. ? Bubbly (carbonated) soft drinks. ? Caffeinated energy drinks and soft drinks.  Limit high-fat foods such as: ? Fatty meat or fried foods. ? Whole milk, cream, butter, or ice cream. ? Nuts and nut butters. ? Pastries, donuts, and sweets made with butter or shortening.  Avoid foods that cause symptoms. These foods may be different for everyone. Common foods that cause symptoms include: ? Tomatoes. ? Oranges, lemons, and limes. ? Peppers. ? Spicy food. ? Onions and garlic. ? Vinegar. Lifestyle  Maintain a healthy weight. Ask your doctor what weight is healthy for you. If you need to lose weight, work with your doctor to do so safely.  Exercise for at least 30 minutes for 5 or more days each week, or as told by your doctor.  Wear loose-fitting clothes.  Do not smoke. If you need help quitting, ask your doctor.  Sleep with the head of your bed higher than your feet. Use a wedge under the  mattress or blocks under the bed frame to raise the head of the bed. Summary  When you have gastroesophageal reflux disease (GERD), food and lifestyle choices are very important in easing your symptoms.  Eat small meals often instead of 3 large meals a day. Eat your meals slowly, and in a place where you are relaxed.  Limit high-fat foods such as fatty meat or fried foods.  Avoid bending over or lying down until 2-3 hours after eating.  Avoid peppermint and spearmint, caffeine, alcohol, and chocolate. This information is not intended to replace advice given to you by your health care provider. Make sure you discuss any questions you have with your health care provider. Document Released: 01/26/2012 Document Revised: 09/01/2016 Document Reviewed: 09/01/2016 Elsevier Interactive Patient Education  2019 [employer]. Second Trimester of Pregnancy  The second trimester is from week 14 through week 27 (month 4 through 6). This is often the time in pregnancy that you feel your best. Often times, morning sickness has lessened or quit. You may have more energy, and you may get hungry more often. Your unborn baby is growing rapidly. At the end of the sixth month, he or she is about 9 inches long and weighs about 1 pounds. You will likely feel the baby move between 18 and 20 weeks of pregnancy. Follow these instructions at home: Medicines  Take over-the-counter and prescription medicines only as told by your doctor. Some medicines are safe and some medicines are not safe during pregnancy.  Take a prenatal  vitamin that contains at least 600 micrograms (mcg) of folic acid.  If you have trouble pooping (constipation), take medicine that will make your stool soft (stool softener) if your doctor approves. Eating and drinking   Eat regular, healthy meals.  Avoid raw meat and uncooked cheese.  If you get low calcium from the food you eat, talk to your doctor about taking a daily calcium supplement.   Avoid foods that are high in fat and sugars, such as fried and sweet foods.  If you feel sick to your stomach (nauseous) or throw up (vomit): ? Eat 4 or 5 small meals a day instead of 3 large meals. ? Try eating a few soda crackers. ? Drink liquids between meals instead of during meals.  To prevent constipation: ? Eat foods that are high in fiber, like fresh fruits and vegetables, whole grains, and beans. ? Drink enough fluids to keep your pee (urine) clear or pale yellow. Activity  Exercise only as told by your doctor. Stop exercising if you start to have cramps.  Do not exercise if it is too hot, too humid, or if you are in a place of great height (high altitude).  Avoid heavy lifting.  Wear low-heeled shoes. Sit and stand up straight.  You can continue to have sex unless your doctor tells you not to. Relieving pain and discomfort  Wear a good support bra if your breasts are tender.  Take warm water baths (sitz baths) to soothe pain or discomfort caused by hemorrhoids. Use hemorrhoid cream if your doctor approves.  Rest with your legs raised if you have leg cramps or low back pain.  If you develop puffy, bulging veins (varicose veins) in your legs: ? Wear support hose or compression stockings as told by your doctor. ? Raise (elevate) your feet for 15 minutes, 3-4 times a day. ? Limit salt in your food. Prenatal care  Write down your questions. Take them to your prenatal visits.  Keep all your prenatal visits as told by your doctor. This is important. Safety  Wear your seat belt when driving.  Make a list of emergency phone numbers, including numbers for family, friends, the hospital, and police and fire departments. General instructions  Ask your doctor about the right foods to eat or for help finding a counselor, if you need these services.  Ask your doctor about local prenatal classes. Begin classes before month 6 of your pregnancy.  Do not use hot tubs, steam  rooms, or saunas.  Do not douche or use tampons or scented sanitary pads.  Do not cross your legs for long periods of time.  Visit your dentist if you have not done so. Use a soft toothbrush to brush your teeth. Floss gently.  Avoid all smoking, herbs, and alcohol. Avoid drugs that are not approved by your doctor.  Do not use any products that contain nicotine or tobacco, such as cigarettes and e-cigarettes. If you need help quitting, ask your doctor.  Avoid cat litter boxes and soil used by cats. These carry germs that can cause birth defects in the baby and can cause a loss of your baby (miscarriage) or stillbirth. Contact a doctor if:  You have mild cramps or pressure in your lower belly.  You have pain when you pee (urinate).  You have bad smelling fluid coming from your vagina.  You continue to feel sick to your stomach (nauseous), throw up (vomit), or have watery poop (diarrhea).  You have a nagging pain  in your belly area.  You feel dizzy. Get help right away if:  You have a fever.  You are leaking fluid from your vagina.  You have spotting or bleeding from your vagina.  You have severe belly cramping or pain.  You lose or gain weight rapidly.  You have trouble catching your breath and have chest pain.  You notice sudden or extreme puffiness (swelling) of your face, hands, ankles, feet, or legs.  You have not felt the baby move in over an hour.  You have severe headaches that do not go away when you take medicine.  You have trouble seeing. Summary  The second trimester is from week 14 through week 27 (months 4 through 6). This is often the time in pregnancy that you feel your best.  To take care of yourself and your unborn baby, you will need to eat healthy meals, take medicines only if your doctor tells you to do so, and do activities that are safe for you and your baby.  Call your doctor if you get sick or if you notice anything unusual about your  pregnancy. Also, call your doctor if you need help with the right food to eat, or if you want to know what activities are safe for you. This information is not intended to replace advice given to you by your health care provider. Make sure you discuss any questions you have with your health care provider. Document Released: 10/21/2009 Document Revised: 09/01/2016 Document Reviewed: 09/01/2016 Elsevier Interactive Patient Education  2019 Else[employer] Inc[employer]    PREGNANCY SUPPORT BELT: You are not alone, Seventy-five percent of women have some sort of abdominal or back pain at some point in their pregnancy. Your baby is growing at a fast pace, which means that your whole body is rapidly trying to adjust to the changes. As your uterus grows, your back may start feeling a bit under stress and this can result in back or abdominal pain that can go from mild, and therefore bearable, to severe pains that will not allow you to sit or lay down comfortably, When it comes to dealing with pregnancy-related pains and cramps, some pregnant women usually prefer natural remedies, which the market is filled with nowadays. For example, wearing a pregnancy support belt can help ease and lessen your discomfort and pain. WHAT ARE THE BENEFITS OF WEARING A PREGNANCY SUPPORT BELT? A pregnancy support belt provides support to the lower portion of the belly taking some of the weight of the growing uterus and distributing to the other parts of your body. It is designed make you comfortable and gives you extra support. Over the years, the pregnancy apparel market has been studying the needs and wants of pregnant women and they have come up with the most comfortable pregnancy support belts that woman could ever ask for. In fact, you will no longer have to wear a stretched-out or bulky pregnancy belt that is visible underneath your clothes and makes you feel even more uncomfortable. Nowadays, a pregnancy support belt is made of comfortable  and stretchy materials that will not irritate your skin but will actually make you feel at ease and you will not even notice you are wearing it. They are easy to put on and adjust during the day and can be worn at night for additional support.  BENEFITS: . Relives Back pain . Relieves Abdominal Muscle and Leg Pain . Stabilizes the Pelvic Ring . Offers a Cushioned Abdominal Lift Pad . Relieves pressure on  the Sciatic Nerve Within Minutes WHERE TO GET YOUR PREGNANCY BELT: [employer] [phone] @[phone]  [address] [place], [place] [id number]

## 2018-10-29 LAB — AFP, SERUM, OPEN SPINA BIFIDA
AFP MoM: 1.01
AFP Value: 39 ng/mL
Gest. Age on Collection Date: 17 weeks
Maternal Age At EDD: 33 yr
OSBR Risk 1 IN: 10000
Test Results:: NEGATIVE
WEIGHT: 146 [lb_av]

## 2018-10-31 ENCOUNTER — Encounter (HOSPITAL_COMMUNITY): Payer: Self-pay

## 2018-11-07 ENCOUNTER — Ambulatory Visit (HOSPITAL_COMMUNITY): Payer: Medicaid Other

## 2018-11-07 ENCOUNTER — Telehealth: Payer: Self-pay | Admitting: Medical

## 2018-11-07 NOTE — Telephone Encounter (Signed)
Attempted to call patient with her next ob appointment. No answer, lvm with the appointment time and date with office number if needing to reschedule. Letter mailed

## 2018-11-23 ENCOUNTER — Ambulatory Visit (HOSPITAL_COMMUNITY): Payer: Self-pay | Admitting: *Deleted

## 2018-11-23 ENCOUNTER — Encounter (HOSPITAL_COMMUNITY): Payer: Self-pay

## 2018-11-23 ENCOUNTER — Other Ambulatory Visit: Payer: Self-pay

## 2018-11-23 ENCOUNTER — Ambulatory Visit (HOSPITAL_COMMUNITY)
Admission: RE | Admit: 2018-11-23 | Discharge: 2018-11-23 | Disposition: A | Discharge status: Home / Self Care | Payer: Self-pay | Source: Ambulatory Visit | Attending: Advanced Practice Midwife | Admitting: Advanced Practice Midwife

## 2018-11-23 VITALS — Temp 98.4°F

## 2018-11-23 DIAGNOSIS — Z363 Encounter for antenatal screening for malformations: Secondary | ICD-10-CM | POA: Insufficient documentation

## 2018-11-23 DIAGNOSIS — O0992 Supervision of high risk pregnancy, unspecified, second trimester: Secondary | ICD-10-CM

## 2018-11-23 DIAGNOSIS — O0990 Supervision of high risk pregnancy, unspecified, unspecified trimester: Secondary | ICD-10-CM | POA: Insufficient documentation

## 2018-11-23 DIAGNOSIS — Z3480 Encounter for supervision of other normal pregnancy, unspecified trimester: Secondary | ICD-10-CM | POA: Insufficient documentation

## 2018-11-23 DIAGNOSIS — O2620 Pregnancy care for patient with recurrent pregnancy loss, unspecified trimester: Secondary | ICD-10-CM | POA: Insufficient documentation

## 2018-11-23 DIAGNOSIS — N96 Recurrent pregnancy loss: Secondary | ICD-10-CM | POA: Insufficient documentation

## 2018-11-23 DIAGNOSIS — Z3A18 18 weeks gestation of pregnancy: Secondary | ICD-10-CM | POA: Insufficient documentation

## 2018-11-23 DIAGNOSIS — R87612 Low grade squamous intraepithelial lesion on cytologic smear of cervix (LGSIL): Secondary | ICD-10-CM | POA: Insufficient documentation

## 2018-11-29 ENCOUNTER — Ambulatory Visit (INDEPENDENT_AMBULATORY_CARE_PROVIDER_SITE_OTHER): Payer: Self-pay | Admitting: Advanced Practice Midwife

## 2018-11-29 ENCOUNTER — Encounter: Payer: Self-pay | Admitting: Advanced Practice Midwife

## 2018-11-29 DIAGNOSIS — Z3480 Encounter for supervision of other normal pregnancy, unspecified trimester: Secondary | ICD-10-CM

## 2018-11-29 DIAGNOSIS — Z3482 Encounter for supervision of other normal pregnancy, second trimester: Secondary | ICD-10-CM

## 2018-11-29 NOTE — Progress Notes (Signed)
   TELEHEALTH VIRTUAL OBSTETRICS VISIT ENCOUNTER NOTE  I connected with KILI ESQUILIN on 11/29/18 at  2:55 PM EDT by telephone at home and verified that I am speaking with the correct person using two identifiers.   I discussed the limitations, risks, security and privacy concerns of performing an evaluation and management service by telephone and the availability of in person appointments. I also discussed with the patient that there may be a patient responsible charge related to this service. The patient expressed understanding and agreed to proceed.  Subjective:  SARON KEIGHTLEY is a 33 y.o. [phone] at [redacted]w[redacted]d being followed for ongoing prenatal care.  She is currently monitored for the following issues for this low-risk pregnancy and has DEPRESSIVE DISORDER, NOS; Low grade squamous intraepithelial lesion (LGSIL) on cervical Pap smear; GASTROESOPHAGEAL REFLUX DISEASE; Headache(784.0); Supervision of other high risk pregnancy, antepartum; and History of recurrent miscarriages on their problem list.  Patient reports no complaints. Reports fetal movement. Denies any contractions, bleeding or leaking of fluid.   The following portions of the patient's history were reviewed and updated as appropriate: allergies, current medications, past family history, past medical history, past social history, past surgical history and problem list.   Objective:   General:  Alert, oriented and cooperative.   Mental Status: Normal mood and affect perceived. Normal judgment and thought content.  Rest of physical exam deferred due to type of encounter  Assessment and Plan:  Pregnancy: G5P1031 at [redacted]w[redacted]d 1. Supervision of other normal pregnancy, antepartum - routine care - Will download webex and activate babyscripts prior to next visit  - Unsure about BTL at this time, and may have husband get vasectomy - desires circumcision for newborn. Baby will be under private insurance from husband.   Preterm labor  symptoms and general obstetric precautions including but not limited to vaginal bleeding, contractions, leaking of fluid and fetal movement were reviewed in detail with the patient.  I discussed the assessment and treatment plan with the patient. The patient was provided an opportunity to ask questions and all were answered. The patient agreed with the plan and demonstrated an understanding of the instructions. The patient was advised to call back or seek an in-person office evaluation/go to MAU at [facility] for any urgent or concerning symptoms. Please refer to After Visit Summary for other counseling recommendations.   I provided 12 minutes of non-face-to-face time during this encounter.  Return in about 4 weeks (around 12/27/2018) for tele-visit .  No future appointments.  Thressa Sheller DNP, CNM  11/29/18  3:08 PM  Center for [employer], [facility]

## 2018-11-30 ENCOUNTER — Encounter: Payer: Self-pay | Admitting: Obstetrics & Gynecology

## 2018-12-26 ENCOUNTER — Telehealth: Payer: Self-pay | Admitting: Obstetrics and Gynecology

## 2018-12-26 NOTE — Telephone Encounter (Signed)
Called the patient to inform of upcoming appointment. Left a detailed voicemail regarding the mychart visit. °

## 2018-12-27 ENCOUNTER — Other Ambulatory Visit: Payer: Self-pay

## 2018-12-27 ENCOUNTER — Telehealth (INDEPENDENT_AMBULATORY_CARE_PROVIDER_SITE_OTHER): Payer: Self-pay

## 2018-12-27 DIAGNOSIS — Z3480 Encounter for supervision of other normal pregnancy, unspecified trimester: Secondary | ICD-10-CM

## 2018-12-27 DIAGNOSIS — Z3A25 25 weeks gestation of pregnancy: Secondary | ICD-10-CM

## 2018-12-27 NOTE — Progress Notes (Signed)
   TELEHEALTH VIRTUAL OBSTETRICS VISIT ENCOUNTER NOTE  I connected with Annaliese Slupski Chiappetta on 12/27/18 at  8:55 AM EDT by telephone at home and verified that I am speaking with the correct person using two identifiers.   I discussed the limitations, risks, security and privacy concerns of performing an evaluation and management service by telephone and the availability of in person appointments. I also discussed with the patient that there may be a patient responsible charge related to this service. The patient expressed understanding and agreed to proceed.  Subjective:  CAHTERINE Hampton is a 33 y.o. [phone] at [redacted]w[redacted]d being followed for ongoing prenatal care.  She is currently monitored for the following issues for this low-risk pregnancy and has DEPRESSIVE DISORDER, NOS; Low grade squamous intraepithelial lesion (LGSIL) on cervical Pap smear; GASTROESOPHAGEAL REFLUX DISEASE; Headache(784.0); Supervision of other high risk pregnancy, antepartum; and History of recurrent miscarriages on their problem list.  Patient reports no complaints. Reports fetal movement. Denies any contractions, bleeding or leaking of fluid.   The following portions of the patient's history were reviewed and updated as appropriate: allergies, current medications, past family history, past medical history, past social history, past surgical history and problem list.   Objective:   General:  Alert, oriented and cooperative.   Mental Status: Normal mood and affect perceived. Normal judgment and thought content.  Rest of physical exam deferred due to type of encounter  Assessment and Plan:  Pregnancy: G5P1031 at [redacted]w[redacted]d 1. Supervision of other normal pregnancy, antepartum -Patient signing up for babyRX today. Unable to check BP, no access to cuff -Patient with many questions regarding COVID. Answered at length and reassurance provided to patient when able. -Reviewed location of [facility] and visitor policies at this time.  -Anticipatory  guidance of next visit in person for GTT and labs.  Preterm labor symptoms and general obstetric precautions including but not limited to vaginal bleeding, contractions, leaking of fluid and fetal movement were reviewed in detail with the patient.  I discussed the assessment and treatment plan with the patient. The patient was provided an opportunity to ask questions and all were answered. The patient agreed with the plan and demonstrated an understanding of the instructions. The patient was advised to call back or seek an in-person office evaluation/go to [facility] at [facility] for any urgent or concerning symptoms. Please refer to After Visit Summary for other counseling recommendations.   I provided 17 minutes of non-face-to-face time during this encounter.  Return in about 3 weeks (around 01/17/2019) for Return OB visit, 2hr GTT and labs.  No future appointments.  Rolm Bookbinder, CNM Center for [employer], [facility]

## 2018-12-30 ENCOUNTER — Telehealth: Payer: Self-pay | Admitting: General Practice

## 2018-12-30 NOTE — Telephone Encounter (Signed)
Received message from Babyscripts that patient reached out to them asking about a BP cuff. Called patient and asked if she had insurance and she states no. Told patient she can come into the office next Friday 5/29 @ 10:20 to receive BP cuff from [place] in office and teaching. Also told patient I changed her Babyscripts account access for that as well. Patient verbalized understanding to all & states she will be here then. Patient had no questions.

## 2019-01-06 ENCOUNTER — Ambulatory Visit (INDEPENDENT_AMBULATORY_CARE_PROVIDER_SITE_OTHER): Payer: Self-pay | Admitting: *Deleted

## 2019-01-06 ENCOUNTER — Other Ambulatory Visit: Payer: Self-pay

## 2019-01-06 VITALS — BP 117/79 | HR 90 | Wt 142.0 lb

## 2019-01-06 DIAGNOSIS — Z3480 Encounter for supervision of other normal pregnancy, unspecified trimester: Secondary | ICD-10-CM

## 2019-01-06 NOTE — Progress Notes (Signed)
Pt arrived for BP teach as scheduled. She has registered for Babyscripts. BP cuff provided and instruction given with adequate return demonstration by pt. She had no further questions. Next appt scheduled on 6/9 @ 0930 for Ob visit and 28 wk labs.

## 2019-01-12 NOTE — Progress Notes (Signed)
I have reviewed this chart and agree with the RN/CMA assessment and management.    Eddie Payette C Auriella Wieand, MD, FACOG Attending Physician, [facility]  

## 2019-01-13 ENCOUNTER — Other Ambulatory Visit: Payer: Self-pay | Admitting: *Deleted

## 2019-01-13 DIAGNOSIS — Z3480 Encounter for supervision of other normal pregnancy, unspecified trimester: Secondary | ICD-10-CM

## 2019-01-16 ENCOUNTER — Telehealth: Payer: Self-pay | Admitting: Internal Medicine

## 2019-01-16 NOTE — Telephone Encounter (Signed)
Patient called to say she needed to reschedule due to her sitter not able to come. Changed visit to a MyChart visit.

## 2019-01-17 ENCOUNTER — Other Ambulatory Visit: Payer: Self-pay

## 2019-01-17 ENCOUNTER — Encounter: Payer: Self-pay | Admitting: Internal Medicine

## 2019-01-31 ENCOUNTER — Encounter: Payer: Self-pay | Admitting: Medical

## 2019-01-31 ENCOUNTER — Encounter: Payer: Self-pay | Admitting: Family Medicine

## 2019-02-08 ENCOUNTER — Telehealth: Payer: Self-pay | Admitting: *Deleted

## 2019-02-08 NOTE — Telephone Encounter (Signed)
Called pt regarding babyscripts.  Per review, pt has never logged a BP but did receive a cuff from babyscripts on 01/03/19.  Pt did not pick up.  Left voicemail advising pt that she was being contacted regarding babyscripts and requesting she call the clinic.  Mychart message sent.

## 2019-02-09 ENCOUNTER — Telehealth: Payer: Self-pay | Admitting: Family Medicine

## 2019-02-09 NOTE — Telephone Encounter (Signed)
Called and spoke to patient, is is aware of her new appointment date, and was told to wear a facemask and no visitors are allowed.

## 2019-02-13 ENCOUNTER — Telehealth: Payer: Self-pay | Admitting: Obstetrics & Gynecology

## 2019-02-13 NOTE — Telephone Encounter (Signed)
Called the patient to pre-screen. Left a detailed voicemail informing if the patient is experiencing any flu-like symptoms such as fever, chills, cough, or shortness of breath and/or has been in contact with anyone who is suspected of/or confirmed to have COVID19 please call back to reschedule the appointment. Upon entering the office please wear a face mask, sanitize hands, and no visitors or children due to COVID19 restrictions. °

## 2019-02-14 ENCOUNTER — Other Ambulatory Visit: Payer: Self-pay

## 2019-02-14 ENCOUNTER — Ambulatory Visit (INDEPENDENT_AMBULATORY_CARE_PROVIDER_SITE_OTHER): Payer: Self-pay | Admitting: Student

## 2019-02-14 DIAGNOSIS — O09899 Supervision of other high risk pregnancies, unspecified trimester: Secondary | ICD-10-CM

## 2019-02-14 DIAGNOSIS — Z3A32 32 weeks gestation of pregnancy: Secondary | ICD-10-CM

## 2019-02-14 DIAGNOSIS — O09893 Supervision of other high risk pregnancies, third trimester: Secondary | ICD-10-CM

## 2019-02-14 MED ORDER — FAMOTIDINE 20 MG PO TABS: 20.0000 mg | ORAL_TABLET | Freq: Two times a day (BID) | ORAL | 2 refills | Status: DC

## 2019-02-14 NOTE — Progress Notes (Signed)
Pt states a lot of cramps lately from baby moving , feels like menstrual cramps.

## 2019-02-14 NOTE — Patient Instructions (Addendum)
Glucose Tolerance Test During Pregnancy Why am I having this test? The glucose tolerance test (GTT) is done to check how your body processes sugar (glucose). This is one of several tests used to diagnose diabetes that develops during pregnancy (gestational diabetes mellitus). Gestational diabetes is a temporary form of diabetes that some women develop during pregnancy. It usually occurs during the second trimester of pregnancy and goes away after delivery. Testing (screening) for gestational diabetes usually occurs between 24 and 28 weeks of pregnancy. You may have the GTT test after having a 1-hour glucose screening test if the results from that test indicate that you may have gestational diabetes. You may also have this test if:  You have a history of gestational diabetes.  You have a history of giving birth to very large babies or have experienced repeated fetal loss (stillbirth).  You have signs and symptoms of diabetes, such as: ? Changes in your vision. ? Tingling or numbness in your hands or feet. ? Changes in hunger, thirst, and urination that are not otherwise explained by your pregnancy. What is being tested? This test measures the amount of glucose in your blood at different times during a period of 3 hours. This indicates how well your body is able to process glucose. What kind of sample is taken?  Blood samples are required for this test. They are usually collected by inserting a needle into a blood vessel. How do I prepare for this test?  For 3 days before your test, eat normally. Have plenty of carbohydrate-rich foods.  Follow instructions from your health care provider about: ? Eating or drinking restrictions on the day of the test. You may be asked to not eat or drink anything other than water (fast) starting 8-10 hours before the test. ? Changing or stopping your regular medicines. Some medicines may interfere with this test. Tell a health care provider about:  All  medicines you are taking, including vitamins, herbs, eye drops, creams, and over-the-counter medicines.  Any blood disorders you have.  Any surgeries you have had.  Any medical conditions you have. What happens during the test? First, your blood glucose will be measured. This is referred to as your fasting blood glucose, since you fasted before the test. Then, you will drink a glucose solution that contains a certain amount of glucose. Your blood glucose will be measured again 1, 2, and 3 hours after drinking the solution. This test takes about 3 hours to complete. You will need to stay at the testing location during this time. During the testing period:  Do not eat or drink anything other than the glucose solution.  Do not exercise.  Do not use any products that contain nicotine or tobacco, such as cigarettes and e-cigarettes. If you need help stopping, ask your health care provider. The testing procedure may vary among health care providers and hospitals. How are the results reported? Your results will be reported as milligrams of glucose per deciliter of blood (mg/dL) or millimoles per liter (mmol/L). Your health care provider will compare your results to normal ranges that were established after testing a large group of people (reference ranges). Reference ranges may vary among labs and hospitals. For this test, common reference ranges are:  Fasting: less than 95-105 mg/dL (5.3-5.8 mmol/L).  1 hour after drinking glucose: less than 180-190 mg/dL (10.0-10.5 mmol/L).  2 hours after drinking glucose: less than 155-165 mg/dL (8.6-9.2 mmol/L).  3 hours after drinking glucose: 140-145 mg/dL (7.8-8.1 mmol/L). What do the  results mean? Results within reference ranges are considered normal, meaning that your glucose levels are well-controlled. If two or more of your blood glucose levels are high, you may be diagnosed with gestational diabetes. If only one level is high, your health care  provider may suggest repeat testing or other tests to confirm a diagnosis. Talk with your health care provider about what your results mean. Questions to ask your health care provider Ask your health care provider, or the department that is doing the test:  When will my results be ready?  How will I get my results?  What are my treatment options?  What other tests do I need?  What are my next steps? Summary  The glucose tolerance test (GTT) is one of several tests used to diagnose diabetes that develops during pregnancy (gestational diabetes mellitus). Gestational diabetes is a temporary form of diabetes that some women develop during pregnancy.  You may have the GTT test after having a 1-hour glucose screening test if the results from that test indicate that you may have gestational diabetes. You may also have this test if you have any symptoms or risk factors for gestational diabetes.  Talk with your health care provider about what your results mean. This information is not intended to replace advice given to you by your health care provider. Make sure you discuss any questions you have with your health care provider. Document Released: 01/26/2012 Document Revised: 11/17/2018 Document Reviewed: 03/08/2017 Elsevier Patient Education  2020 Elsevier Inc.   AREA PEDIATRIC/FAMILY PRACTICE PHYSICIANS  [place] ([address])  [facility] Melodie Bouillon, MD; Lum Babe, MD; Sheffield Slider, MD; Leveda Anna, MD; McDiarmid, MD; Jerene Bears, MD; Jennette Kettle, MD; Gwendolyn Grant, MD o [address] [place], [place] [address] o [phone] o Mon-Fri 8:30-12:30, 1:30-5:00 o Providers come to see babies at [facility] o Accepting [facility] [place] o Limited providers who accept newborns: Docia Chuck, MD; Kateri Plummer, MD; Paulino Rily, MD o [address], [place], [place] [address] o [phone] o Mon-Fri 8:00-5:30 o Babies seen by providers at [facility] o Does NOT accept Medicaid o Please call early in hospitalization for appointment (limited availability)   [facility] Fatima Sanger, MD o [address] [place], [place] [address] o [phone] o Mon, Tue, Thur, Fri 8:30-5:00, Wed 10:00-7:00 (closed 1-2pm) o Babies seen by [facility] providers o Accepting Medicaid  Donnie Coffin - Pediatrician Fae Pippin, MD o [address], [place], [place] [address] o [phone] o Mon-Fri 8:30-5:00, Sat 8:30-12:00 o Provider comes to see babies at [facility] o Accepting Medicaid o Must have been referred from current patients or contacted office prior to delivery  [facility]) Leotis Pain, MD; Ave Filter, MD; Luna Fuse, MD; Kennedy Bucker, MD; Konrad Dolores, MD; Kathlene November, MD; Jenne Campus, MD; Lubertha South, MD; Wynetta Emery, MD; Duffy Rhody, MD; Gerre Couch, NP; Shirl Harris, NP o [address], [place], [place] [address] o [phone] o Mon, Tue, Thur, Fri 8:30-5:30, Wed 9:30-5:30, Sat 8:30-12:30 o Babies seen by [facility] providers o Accepting Medicaid o Only accepting infants of first-time parents or siblings of current patients o Hospital discharge coordinator will make follow-up appointment  Cyril Mourning o [address], [place], [place]  [address] o [phone]   Fax - [phone]  [facility] o [address], [place], [place]  [address] o Phone - [phone]   Fax - [phone]  Lucio Edward  o [address], [place], [place]  [address] o [phone]  [place] [phone])  [facility] Jorge Mandrilo Bates, MD; Alita ChyleBrassfield, MD; Princella Ionooper, Cox, MD; MD; Earlene Plateravis, MD; Jamesetta Orleansovico, MD; Alvera NovelEttefaugh, MD; Clarene DukeLittle, MD; Rana SnareLowe, MD; Carmon GinsbergKeiffer, MD; Alinda MoneyMelvin, MD; Hosie PoissonSumner, MD; Mayford KnifeWilliams, MD o [address], [place], [place] [address] o [phone] o Mon-Fri 8:30-5:00 (extended evenings Mon-Thur as needed), Sat-Sun 10:00-1:00 o Providers  come to see babies at [facility] o Accepting Medicaid for families of first-time babies and families with all children in the household age 423 and under. Must register with office prior to making appointment (M-F only).  [facility] Odella Aquaso Henson, NP; Lynelle DoctorKnapp, MD; Susann GivensLalonde, MD; Thibodauxysinger, [place] o [address] [place], [place] [address] o [phone] o Mon-Fri 8:00-5:00 o Babies seen by providers at [facility] o Does NOT accept Medicaid/Commercial Insurance Only  [facility] at [place] ([facility])  Suzette Battiesto Artis, MD; Zachery DauerBarnes, MD; Stefan ChurchBratton, MD; Sabino Dickoccaro, MD; Quitman LivingsLockett Gardner, MD; Farris HasKramer, MD; Gaynell FaceMarshall, MD; Betha LoaNetherton, MD; Colon FlatteryPoleto, MD; Clifton JamesSkinner, MD o [address] [place], [place] [address] o [phone] o Mon-Fri 8:30-5:30, Sat (Oct.-Mar.) 9:00-1:00 o Babies seen by providers at [facility] o Accepting Medicaid  [place] [phone])  [facility] [place] o Reid, MD; Sheliah HatchWarner, MD o [address], [place], [place] [address] o [phone] o Mon-Fri 8:30-5:00, Sat 8:30-12:00 o Providers come to see babies at [facility] o Does NOT accept Leahi HospitalMedicaid  [facility] Cindy Hazyo Becker, [place]; Tracie HarrierHagler, MD; New OxfordScifres, [place]; Wynelle LinkSun, MD; Azucena CecilSwayne, MD o [address], [place], [place] [address] o [phone] o Mon-Fri 8:00-5:00 o Babies seen by providers at [facility] o Does NOT accept Medicaid o Only accepting babies of parents who are patients o Please call early in hospitalization for appointment (limited availability)  [facility] Lamar Beneso Clark, MD; Abran CantorFrye, MD; Early OsmondKelleher, MD; Cherre HugerMack, NP; Hyacinth MeekerMiller, MD; Dwan Bolt'Keller, MD; Jarold MottoPatterson, NP; Dario GuardianPudlo, MD; Talmage NapPuzio, MD; Maisie Fushomas, MD; Pricilla Holmucker, MD; Tama Highwiselton, MD o [address], [place], [place] [address] o [phone] o Mon-Fri 8:00-5:00, Sat 9:00-12:00 o Providers come to see babies at [facility] o Does NOT accept [facility] State University Medical CenterMedicaid  [place]  [place] [phone])  [facility] o Limited providers accepting new patients: Drema PryBrake, NP; Delena ServeWharton, PA o [address], [place], [place] [address] o [phone] o Mon-Fri 8:00-5:00 o Babies seen by providers at [facility] o Does NOT accept Medicaid o Only accepting babies of parents who are patients o Please call early in hospitalization for appointment (limited availability)  [facility] Luan Pullingo Gay, MD; Nash DimmerQuinlan, MD o [address] [place], [place] [address] o [phone] (press 1 to schedule appointment) o Mon-Fri 8:00-5:00 o Providers come to see babies at [facility] o Does NOT accept [facility] CenterMedicaid  [facility] Cristino Marteso Mazer, MD o [address] [place], [place] [address] o [phone] o Mon-Fri 8:30-5:00 (lunch 12:30-1:00), extended hours by appointment only Wed 5:00-6:30 o Babies seen by [facility] providers o Accepting Medicaid  [facility] o Banks, MD; SwazilandJordan, MD; Hassan RowanKoberlein, MD o [address], [place], [place] [address] o [phone] o Mon-Fri 8:00-5:00 o Babies seen by [facility] providers o Does NOT accept Medicaid  [facility] [address] o Parker, MD; Durene CalHunter, MD; McNaryWallace, DO o [address] [place], [place] [address] o [phone] o Mon-Fri 8:00-5:00 o Babies seen by [facility] providers o Does NOT accept [facility]edicaid  [facility]  Clarene Reamero Brandon, PA; PaxtonBrecken, [place]; Wintersvillehristy, NP; Avis Epleyees, MD; Vonna KotykeClaire, MD; Clance BolleWeese, MD; Stevphen RochesterHansen, NP; Arvilla MarketMills, NP; Ann MakiParrish, NP; Otis DialsSmoot, NP; Vaughan BastaSummer, MD; YoungsvilleVapne, MD o [address] [place], [place] [address] o [phone] o Mon-Fri 8:30-5:00, Sat 10:00-1:00 o Providers come to see babies at [facility] o Does NOT accept Medicaid o Free prenatal information session Tuesdays at 4:45pm  [facility] Luna Kitchenso Bouska, MD; MadridGordon, [place]; PatrickJeffery, [place]; Valarie ConesWeber, [place] o [address] [place]  [address] o [phone] o Mon-Fri 7:30-5:30 o Babies seen by [facility] providers  [facility] o [address], [place], [place]  [address] o [phone]   Fax - [phone]  [place] [phone] & [phone])  [facility] Alphonsa Overallo Reese, MD o [address] [place], [place] [address] o [phone] o Mon-Thur 8:00-6:00 o Providers come to see babies at [facility] o Accepting Medicaid  [facility] Zenon Mayoo Anderson, NP; Cyndia BentBadger, MD; [place] GroveBeal, [place]; LongtownSpencer, [place] o [address] [place], [place] [address] o [phone] o Mon-Thur 7:30-7:30, Fri 7:30-4:30 o Babies seen by [facility] providers o Accepting [facility] HospitalMedicaid  [facility] Cheryle Horsfallo Agbuya, MD; Janene HarveyKlett, NP; Vonita Mossomgoolam, MD o [address], [place], [place] [address] o [phone] o Mon-Fri 8:30-5:00, Sat 8:30-12:00 o Providers come to see babies at [facility] o Accepting Medicaid o Must have Meet & Greet appointment at office prior to delivery  [facility] [place] ([facility] [place]) Llana Alimento McCord, MD; Earlene PlaterWallace, MD; Lucretia RoersWood, MD o [address], [place], [place] [address] o [phone] o Mon-Wed 8:00-6:00, Thur-Fri 8:00-5:00, Sat 9:00-12:00 o Providers come to see babies at [facility] o Does NOT accept Medicaid o Only accepting siblings of current patients  [facility] [place]  o [address], [place], [place]  [address] o [phone]   Fax - [phone]  [facility] at [facility] Jeanette o [phone] N[address], [place], [place]  [address] o [phone]   Fax - [phone]  [place] [phone] & [phone])  Nature conservation officerLeBauer [facility] o Oberonirigliano, [place]; RosebudMatthews, DO o [address] [place], [place] [address] o [phone] o Mon-Fri 7:00-5:00 o Babies seen by [facility] providers o Does NOT accept  Medicaid  [facility] Ellis Savageo Briscoe, MD; Cor[place] TerraceHowley, [place]; [place], [place] o [address], [place], [place] [address] o [phone] o Mon-Fri 8:00-5:00 o Babies seen by [facility] providers o Accepting Medicaid  [facility] - [address] Franne Fortso Boyd, MD; Salemhurch, [place]; SpringfieldJones, NP; College[place]born, [place] o [address], [place], [place] [address] o [phone] o Mon-Fri 8:00-5:00 o Babies seen by providers at [facility] o Accepting [facility] Health CenterMedicaid  [place] [phone])  [facility] [place] Tetherowo [place], [place] o [address] [place], [place] [address] o [phone] o Mon-Fri 8:00-5:00 o Babies seen by [facility] providers o Does NOT accept Medicaid o Limited availability, please call early in hospitalization to schedule follow-up  [facility] Jolee Ewingo Calderon, [place]; Eddie Candleummings, MD; Normand Sloopillard, MD; [place] BeachMartin, [place]; Constance Goltzlson, MD; Eagle LakeVanDeven, [place] o [id number] [facility] [address], [place], [place] [address] o [phone] o Mon-Fri 8:30-5:00, Sat 9:00-12:00 o Babies seen by providers at [facility] o Accepting Medicaid o Please register online then schedule online or call office o [email or web address]  [facility]) o  Yong Channel, NP; Dwyane Dee, MD; Leonidas Romberg, PA o [address] [place], [place], [place] [address] o [phone] o Mon-Fri 8:00-5:00 o Babies seen by providers at [place] ([facility] [employer]) Dairl Ponder, MD; Rayvon Char, NP; Melina Modena, MD o [address] [place], [place], [place] [address] o [phone] o Mon-Fri 8:00-5:30, Sat&Sun by appointment (phones open at 8:30) o Babies seen by [facility] providers o Accepting Medicaid o Must be a first-time baby or sibling of current patient  [place], [address], [place], [place]  [address] o [phone]   Fax - [phone]  [place] [phone] & [phone])  [place], [place]; [place], [place]; [place], MD; [place], [place]; Harrell Lark, MD o [address], [place], [place] [phone] o Mon-Thur 8:00-7:00, Fri 8:00-5:00, Sat 8:00-12:00, Sun 9:00-12:00 o Babies seen by [facility] providers o Accepting Medicaid  [facility] - Family Medicine at [facility], MD; Ruthann Cancer, MD; [facility], MD o [address] [place], [place], [place] [address] o [phone] o Mon-Thur 8:00-5:00 o Babies seen by providers at [place] Medicaid  [facility] [place] at [place], MD; Coe-Goins, MD; Amedeo Plenty, MD; Bobby Rumpf, MD; List, MD; Lavonia Drafts, MD; Ruthann Cancer, MD; Selinda Eon, MD; Audie Box, MD; Jim Like, MD; Christie Nottingham, MD; Hubbard Hartshorn, MD; Modena Nunnery, MD o [address] [place], [place] [address] o [phone] o Mon-Fri 8:00-5:30, Sat (Oct.-Mar.) 9:00-1:00 o Babies seen by providers at [facility] o Accepting Medicaid o Must fill out new patient packet, available online at [email or web address]  [place] ([place]) Barnabas Lister, NP; [place]on Kingfisher, NP; Claiborne Billings, NP; Rolla Plate, MD; [place], [place]; Carola Rhine, MD; Tyron Russell, MD; Delia Chimes, NP o [address], [place], [place] [address] o [phone] o Mon-Thur 8:00-5:30, Fri 8:00-5:00 o Babies seen by providers at [facility]  [place] [phone])  [place], [place]; Gust[place], MD; Briar Chapel, MD; [place], [place] o [address] [place], [place] [address] o [phone] o Mon-Fri 8:00-5:00 o Babies seen by providers at [place] [phone])  [place] at [place], [place]; Olen Pel, MD; [place], [place], [place], [place]  [address] o [phone] o Mon-Fri 8:00-5:00 o Babies seen by providers at [facility] o Does NOT accept Medicaid o Limited appointment availability, please call early in hospitalization   [place] at [place], [place]; Tec[place], MD o [address], [place], [place] [address] o [phone] o Mon-Fri 8:00-5:00 o Babies seen by [facility] providers o Does NOT accept [facility] - [facility] Su Grand, MD; Guy Sandifer, MD; [place], [place]; [place], [address], [place], [place] [address] o [phone] o Mon-Fri 8:00-5:00 o After hours clinic [facility] [place], [place] [address]) [phone] Mon-Fri 5:00-8:00, Sat 12:00-6:00, Sun 10:00-4:00 o Babies seen by [facility] providers o Accepting Medicaid  [place] at [facility] o [address], [place], [place]  [address] o [phone]   Fax - [phone]  [place] [phone])  [place] at [facility], MD o [address] [place], [place], [place] [address] o [phone] o Mon-Fri 8:00-5:00 o  Babies seen by [facility] providers o Does NOT accept Medicaid  [facility] [place] [facility] [place]) Tomi Likenso Eksir, MD o [address], [place], [place] [address] o [phone] o Mon-Thur 8:00-7:00, Fri 8:00-5:00, Sat 8:00-12:00 o Babies seen by providers at [facility] o Accepting Medicaid - but does not have vaccinations in office (must be received elsewhere) o Limited availability, please call early in hospitalization  [place] [phone])  [facility]  o Wyvonne Lenzharlene Flemming, MD o [address], [place] [address] o [phone]  Fax [phone]

## 2019-02-14 NOTE — Progress Notes (Signed)
   PRENATAL VISIT NOTE  Subjective:  Nichole Hampton is a 33 y.o. [id number] at [redacted]w[redacted]d being seen today for ongoing prenatal care.  She is currently monitored for the following issues for this low-risk pregnancy and has DEPRESSIVE DISORDER, NOS; Low grade squamous intraepithelial lesion (LGSIL) on cervical Pap smear; GASTROESOPHAGEAL REFLUX DISEASE; Headache(784.0); Supervision of other high risk pregnancy, antepartum; and History of recurrent miscarriages on their problem list.  Patient reports no complaints. Still having a lot of acid reflux at night. Would like something for it; she has been taking her BP at home and putting them in the [facility] app but is not actually uploading them. She says that they are al 103/70, no values 140/90. Her husband is planning a vasectomy.  Contractions: Not present. Vag. Bleeding: None.  Movement: Present. Denies leaking of fluid.   The following portions of the patient's history were reviewed and updated as appropriate: allergies, current medications, past family history, past medical history, past social history, past surgical history and problem list.   Objective:   Vitals:   02/14/19 1130  BP: 101/71  Pulse: 100  Temp: 98.6 F (37 C)  Weight: 144 lb 1.6 oz (65.4 kg)    Fetal Status: Fetal Heart Rate (bpm): 141 Fundal Height: 32 cm Movement: Present     General:  Alert, oriented and cooperative. Patient is in no acute distress.  Skin: Skin is warm and dry. No rash noted.   Cardiovascular: Normal heart rate noted  Respiratory: Normal respiratory effort, no problems with respiration noted  Abdomen: Soft, gravid, appropriate for gestational age.  Pain/Pressure: Present     Pelvic: Cervical exam deferred        Extremities: Normal range of motion.  Edema: Trace  Mental Status: Normal mood and affect. Normal behavior. Normal judgment and thought content.   Assessment and Plan:  Pregnancy: [id number] at [redacted]w[redacted]d 1. Supervision of other high risk pregnancy,  antepartum -Emphasized with patient need to record her BPs in [facility] so that we can review them. Reminder her of the worrisome values and when to come to MAU.  -Husband wants vasectomy; did not sign BTL today.  -needs 28 week labs; appt to be scheduled for later in the week. Instructed patient on fasting.  -Rx for Pepcid given.  -List of pediatricians given.   Preterm labor symptoms and general obstetric precautions including but not limited to vaginal bleeding, contractions, leaking of fluid and fetal movement were reviewed in detail with the patient. Please refer to After Visit Summary for other counseling recommendations.   Return in 2 days for GTT and 28 weeks labs, in person LROB in 4 weeks.  Future Appointments  Date Time Provider [place]  02/21/2019  9:30 AM [facility]  03/14/2019  9:35 AM Ardean Larsen, Mervyn Skeeters, CNM [facility] [place], [place]

## 2019-02-14 NOTE — Progress Notes (Deleted)
BTS Signed-02/14/2019

## 2019-02-21 ENCOUNTER — Other Ambulatory Visit: Payer: Self-pay

## 2019-02-21 DIAGNOSIS — Z3480 Encounter for supervision of other normal pregnancy, unspecified trimester: Secondary | ICD-10-CM

## 2019-02-22 LAB — CBC
Hematocrit: 34.3 % (ref 34.0–46.6)
Hemoglobin: 11.8 g/dL (ref 11.1–15.9)
MCH: 33.1 pg — ABNORMAL HIGH (ref 26.6–33.0)
MCHC: 34.4 g/dL (ref 31.5–35.7)
MCV: 96 fL (ref 79–97)
Platelets: 270 10*3/uL (ref 150–450)
RBC: 3.56 x10E6/uL — ABNORMAL LOW (ref 3.77–5.28)
RDW: 13.5 % (ref 11.7–15.4)
WBC: 16.6 10*3/uL — ABNORMAL HIGH (ref 3.4–10.8)

## 2019-02-22 LAB — RPR: RPR Ser Ql: NONREACTIVE

## 2019-02-22 LAB — HIV ANTIBODY (ROUTINE TESTING W REFLEX): HIV Screen 4th Generation wRfx: NONREACTIVE

## 2019-02-22 LAB — GLUCOSE TOLERANCE, 2 HOURS W/ 1HR
Glucose, 1 hour: 99 mg/dL (ref 65–179)
Glucose, 2 hour: 68 mg/dL (ref 65–152)
Glucose, Fasting: 77 mg/dL (ref 65–91)

## 2019-03-10 ENCOUNTER — Telehealth: Payer: Self-pay | Admitting: Obstetrics & Gynecology

## 2019-03-10 NOTE — Telephone Encounter (Signed)
Called the patient to inform of the appointment. Left a detailed voicemail stating if she has been in contact with someone who has had covid-19, waiting on test results for covid-19, or has been diagnosed with covid-19 please call to reschedule the appointment. Also, if you’ve had any flu-like symptoms such as fever, rash, sore throat or shortness of breath please call our office to reschedule. If you answered no to previous questions, please also note that at this time no children or visitors are allowed due to covid-19 restrictions. If you have any questions or concerns, please call our office. ° °

## 2019-03-14 ENCOUNTER — Inpatient Hospital Stay (HOSPITAL_COMMUNITY)
Admission: RE | Admit: 2019-03-14 | Discharge: 2019-03-14 | Disposition: A | Discharge status: Home / Self Care | Payer: Medicaid Other | Attending: Obstetrics and Gynecology | Admitting: Obstetrics and Gynecology

## 2019-03-14 ENCOUNTER — Encounter: Payer: Self-pay | Admitting: Student

## 2019-03-14 ENCOUNTER — Telehealth: Payer: Self-pay | Admitting: Student

## 2019-03-14 ENCOUNTER — Other Ambulatory Visit: Payer: Self-pay

## 2019-03-14 ENCOUNTER — Encounter (HOSPITAL_COMMUNITY): Payer: Self-pay | Admitting: *Deleted

## 2019-03-14 DIAGNOSIS — O4703 False labor before 37 completed weeks of gestation, third trimester: Secondary | ICD-10-CM | POA: Diagnosis not present

## 2019-03-14 DIAGNOSIS — O479 False labor, unspecified: Secondary | ICD-10-CM

## 2019-03-14 DIAGNOSIS — Z3A36 36 weeks gestation of pregnancy: Secondary | ICD-10-CM | POA: Diagnosis not present

## 2019-03-14 NOTE — Discharge Instructions (Signed)
Braxton Hicks Contractions °Contractions of the uterus can occur throughout pregnancy, but they are not always a sign that you are in labor. You may have practice contractions called Braxton Hicks contractions. These false labor contractions are sometimes confused with true labor. °What are Braxton Hicks contractions? °Braxton Hicks contractions are tightening movements that occur in the muscles of the uterus before labor. Unlike true labor contractions, these contractions do not result in opening (dilation) and thinning of the cervix. Toward the end of pregnancy (32-34 weeks), Braxton Hicks contractions can happen more often and may become stronger. These contractions are sometimes difficult to tell apart from true labor because they can be very uncomfortable. You should not feel embarrassed if you go to the hospital with false labor. °Sometimes, the only way to tell if you are in true labor is for your health care provider to look for changes in the cervix. The health care provider will do a physical exam and may monitor your contractions. If you are not in true labor, the exam should show that your cervix is not dilating and your water has not broken. °If there are no other health problems associated with your pregnancy, it is completely safe for you to be sent home with false labor. You may continue to have Braxton Hicks contractions until you go into true labor. °How to tell the difference between true labor and false labor °True labor °· Contractions last 30-70 seconds. °· Contractions become very regular. °· Discomfort is usually felt in the top of the uterus, and it spreads to the lower abdomen and low back. °· Contractions do not go away with walking. °· Contractions usually become more intense and increase in frequency. °· The cervix dilates and gets thinner. °False labor °· Contractions are usually shorter and not as strong as true labor contractions. °· Contractions are usually irregular. °· Contractions  are often felt in the front of the lower abdomen and in the groin. °· Contractions may go away when you walk around or change positions while lying down. °· Contractions get weaker and are shorter-lasting as time goes on. °· The cervix usually does not dilate or become thin. °Follow these instructions at home: ° °· Take over-the-counter and prescription medicines only as told by your health care provider. °· Keep up with your usual exercises and follow other instructions from your health care provider. °· Eat and drink lightly if you think you are going into labor. °· If Braxton Hicks contractions are making you uncomfortable: °? Change your position from lying down or resting to walking, or change from walking to resting. °? Sit and rest in a tub of warm water. °? Drink enough fluid to keep your urine pale yellow. Dehydration may cause these contractions. °? Do slow and deep breathing several times an hour. °· Keep all follow-up prenatal visits as told by your health care provider. This is important. °Contact a health care provider if: °· You have a fever. °· You have continuous pain in your abdomen. °Get help right away if: °· Your contractions become stronger, more regular, and closer together. °· You have fluid leaking or gushing from your vagina. °· You pass blood-tinged mucus (bloody show). °· You have bleeding from your vagina. °· You have low back pain that you never had before. °· You feel your baby’s head pushing down and causing pelvic pressure. °· Your baby is not moving inside you as much as it used to. °Summary °· Contractions that occur before labor are   called Braxton Hicks contractions, false labor, or practice contractions. °· Braxton Hicks contractions are usually shorter, weaker, farther apart, and less regular than true labor contractions. True labor contractions usually become progressively stronger and regular, and they become more frequent. °· Manage discomfort from Braxton Hicks contractions  by changing position, resting in a warm bath, drinking plenty of water, or practicing deep breathing. °This information is not intended to replace advice given to you by your health care provider. Make sure you discuss any questions you have with your health care provider. °Document Released: 12/10/2016 Document Revised: 07/09/2017 Document Reviewed: 12/10/2016 °Elsevier Patient Education © 2020 Elsevier Inc. ° °Safe Medications in Pregnancy  ° °Acne: °Benzoyl Peroxide °Salicylic Acid ° °Backache/Headache: °Tylenol: 2 regular strength every 4 hours OR °             2 Extra strength every 6 hours ° °Colds/Coughs/Allergies: °Benadryl (alcohol free) 25 mg every 6 hours as needed °Breath right strips °Claritin °Cepacol throat lozenges °Chloraseptic throat spray °Cold-Eeze- up to three times per day °Cough drops, alcohol free °Flonase (by prescription only) °Guaifenesin °Mucinex °Robitussin DM (plain only, alcohol free) °Saline nasal spray/drops °Sudafed (pseudoephedrine) & Actifed ** use only after [redacted] weeks gestation and if you do not have high blood pressure °Tylenol °Vicks Vaporub °Zinc lozenges °Zyrtec  ° °Constipation: °Colace °Ducolax suppositories °Fleet enema °Glycerin suppositories °Metamucil °Milk of magnesia °Miralax °Senokot °Smooth move tea ° °Diarrhea: °Kaopectate °Imodium A-D ° °*NO pepto Bismol ° °Hemorrhoids: °Anusol °Anusol HC °Preparation H °Tucks ° °Indigestion: °Tums °Maalox °Mylanta °Zantac  °Pepcid ° °Insomnia: °Benadryl (alcohol free) 25mg every 6 hours as needed °Tylenol PM °Unisom, no Gelcaps ° °Leg Cramps: °Tums °MagGel ° °Nausea/Vomiting:  °Bonine °Dramamine °Emetrol °Ginger extract °Sea bands °Meclizine  °Nausea medication to take during pregnancy:  °Unisom (doxylamine succinate 25 mg tablets) Take one tablet daily at bedtime. If symptoms are not adequately controlled, the dose can be increased to a maximum recommended dose of two tablets daily (1/2 tablet in the morning, 1/2 tablet  mid-afternoon and one at bedtime). °Vitamin B6 100mg tablets. Take one tablet twice a day (up to 200 mg per day). ° °Skin Rashes: °Aveeno products °Benadryl cream or 25mg every 6 hours as needed °Calamine Lotion °1% cortisone cream ° °Yeast infection: °Gyne-lotrimin 7 °Monistat 7 ° ° °**If taking multiple medications, please check labels to avoid duplicating the same active ingredients °**take medication as directed on the label °** Do not exceed 4000 mg of tylenol in 24 hours °**Do not take medications that contain aspirin or ibuprofen ° ° ° ° °

## 2019-03-14 NOTE — MAU Provider Note (Signed)
S: [name] is a 33 y.o. [id number] at [redacted]w[redacted]d  who presents to MAU today complaining contractions q 7 minutes since this morning. She denies vaginal bleeding. She denies LOF. She reports normal fetal movement.    O: BP 118/77 (BP Location: Right Arm)   Pulse (!) 102   Temp 98.1 F (36.7 C) (Oral)   Resp 18   LMP 06/22/2018   SpO2 98%  GENERAL: Well-developed, well-nourished female in no acute distress.  HEAD: Normocephalic, atraumatic.  CHEST: Normal effort of breathing, regular heart rate ABDOMEN: Soft, nontender, gravid  Cervical exam:  Dilation: 2.5 Effacement (%): 70 Station: -2 Presentation: Vertex Exam by:: F. Beg, RNC   Fetal Monitoring: Baseline: 130 Variability: moderate Accelerations: 15x15 Decelerations: none Contractions: q10-15 minutes  Patient was observed and checked after 1 hour with no cervical change   A: SIUP at [redacted]w[redacted]d  False labor  P: Discharge home -Labor precautions discussed -Patient advised to [facility] with [facility] as scheduled for prenatal care -Patient may return to MAU as needed or if her condition were to change or worsen   Wende Mott, [place] 03/14/2019 3:43 PM

## 2019-03-14 NOTE — Progress Notes (Signed)
I have communicated with Dr. Lavonia Drafts and reviewed vital signs:  Vitals:   03/14/19 1408 03/14/19 1541  BP: 117/62 118/77  Pulse: 91 (!) 102  Resp: 16 18  Temp: 98.2 F (36.8 C) 98.1 F (36.7 C)  SpO2: 100% 98%    Vaginal exam:  Dilation: 2.5 Effacement (%): 70 Station: -2 Presentation: Vertex Exam by:: F. Grinage, RNC,   Also reviewed contraction pattern and that non-stress test is reactive.  It has been documented that patient is contracting every 1.5-6 minutes with no cervical change over 1 hour and 15 minutes hours not indicating active labor.  Patient denies any other complaints.  Based on this report provider has given order for discharge.  A discharge order and diagnosis entered by a provider.   Labor discharge instructions reviewed with patient.

## 2019-03-14 NOTE — Telephone Encounter (Addendum)
Patient called and said that was having contractions over night that are getting stronger and more painful now about 30-40 seconds long and 3 minutes apart. She has started feeling nauseated this morning. Feels strong fetal movements, and no LOF.   Recommended that patient go to MAU for labor rule out; MAU provider notified.   Nichole Hampton

## 2019-03-14 NOTE — MAU Note (Signed)
.   Nichole Hampton is a 33 y.o. at [redacted]w[redacted]d here in MAU reporting: contractions that started yesterday but got stronger today. Denies any vaginal bleeding or LOF  Onset of complaint: yesterday Pain score:5 Vitals:   03/14/19 1408  BP: 117/62  Pulse: 91  Resp: 16  Temp: 98.2 F (36.8 C)  SpO2: 100%     FHT:145 Lab orders placed from triage: UA

## 2019-03-20 ENCOUNTER — Other Ambulatory Visit: Payer: Self-pay

## 2019-03-20 ENCOUNTER — Encounter (HOSPITAL_COMMUNITY): Payer: Self-pay

## 2019-03-20 ENCOUNTER — Inpatient Hospital Stay (HOSPITAL_COMMUNITY)
Admission: AD | Admit: 2019-03-20 | Discharge: 2019-03-20 | Disposition: A | Discharge status: Home / Self Care | Payer: Medicaid Other | Attending: Obstetrics & Gynecology | Admitting: Obstetrics & Gynecology

## 2019-03-20 ENCOUNTER — Telehealth: Payer: Self-pay

## 2019-03-20 DIAGNOSIS — O471 False labor at or after 37 completed weeks of gestation: Secondary | ICD-10-CM | POA: Insufficient documentation

## 2019-03-20 DIAGNOSIS — Z3A37 37 weeks gestation of pregnancy: Secondary | ICD-10-CM | POA: Diagnosis not present

## 2019-03-20 DIAGNOSIS — O479 False labor, unspecified: Secondary | ICD-10-CM

## 2019-03-20 HISTORY — DX: Headache, unspecified: R51.9

## 2019-03-20 NOTE — Telephone Encounter (Signed)
Attempted to call pt back. No answer. LM for pt to call the office back at her earliest convenience.

## 2019-03-20 NOTE — Telephone Encounter (Signed)
Pt called requesting a call back.

## 2019-03-20 NOTE — Discharge Instructions (Signed)
Braxton Hicks Contractions Contractions of the uterus can occur throughout pregnancy, but they are not always a sign that you are in labor. You may have practice contractions called Braxton Hicks contractions. These false labor contractions are sometimes confused with true labor. What are Braxton Hicks contractions? Braxton Hicks contractions are tightening movements that occur in the muscles of the uterus before labor. Unlike true labor contractions, these contractions do not result in opening (dilation) and thinning of the cervix. Toward the end of pregnancy (32-34 weeks), Braxton Hicks contractions can happen more often and may become stronger. These contractions are sometimes difficult to tell apart from true labor because they can be very uncomfortable. You should not feel embarrassed if you go to the hospital with false labor. Sometimes, the only way to tell if you are in true labor is for your health care provider to look for changes in the cervix. The health care provider will do a physical exam and may monitor your contractions. If you are not in true labor, the exam should show that your cervix is not dilating and your water has not broken. If there are no other health problems associated with your pregnancy, it is completely safe for you to be sent home with false labor. You may continue to have Braxton Hicks contractions until you go into true labor. How to tell the difference between true labor and false labor True labor  Contractions last 30-70 seconds.  Contractions become very regular.  Discomfort is usually felt in the top of the uterus, and it spreads to the lower abdomen and low back.  Contractions do not go away with walking.  Contractions usually become more intense and increase in frequency.  The cervix dilates and gets thinner. False labor  Contractions are usually shorter and not as strong as true labor contractions.  Contractions are usually irregular.  Contractions  are often felt in the front of the lower abdomen and in the groin.  Contractions may go away when you walk around or change positions while lying down.  Contractions get weaker and are shorter-lasting as time goes on.  The cervix usually does not dilate or become thin. Follow these instructions at home:   Take over-the-counter and prescription medicines only as told by your health care provider.  Keep up with your usual exercises and follow other instructions from your health care provider.  Eat and drink lightly if you think you are going into labor.  If Braxton Hicks contractions are making you uncomfortable: ? Change your position from lying down or resting to walking, or change from walking to resting. ? Sit and rest in a tub of warm water. ? Drink enough fluid to keep your urine pale yellow. Dehydration may cause these contractions. ? Do slow and deep breathing several times an hour.  Keep all follow-up prenatal visits as told by your health care provider. This is important. Contact a health care provider if:  You have a fever.  You have continuous pain in your abdomen. Get help right away if:  Your contractions become stronger, more regular, and closer together.  You have fluid leaking or gushing from your vagina.  You pass blood-tinged mucus (bloody show).  You have bleeding from your vagina.  You have low back pain that you never had before.  You feel your baby's head pushing down and causing pelvic pressure.  Your baby is not moving inside you as much as it used to. Summary  Contractions that occur before labor are   called Braxton Hicks contractions, false labor, or practice contractions.  Braxton Hicks contractions are usually shorter, weaker, farther apart, and less regular than true labor contractions. True labor contractions usually become progressively stronger and regular, and they become more frequent.  Manage discomfort from Braxton Hicks contractions  by changing position, resting in a warm bath, drinking plenty of water, or practicing deep breathing. This information is not intended to replace advice given to you by your health care provider. Make sure you discuss any questions you have with your health care provider. Document Released: 12/10/2016 Document Revised: 07/09/2017 Document Reviewed: 12/10/2016 Elsevier Patient Education  2020 Elsevier Inc.  

## 2019-03-20 NOTE — MAU Note (Signed)
Has been having contractions for like 6 days. Feeling them in her rt groin, back and sides. Been throwing last couple days.  Is just miserable.  Lost her mucous plug and had some blood come out too. Was 2 cm on 8/4

## 2019-03-20 NOTE — Progress Notes (Signed)
   03/20/19 1524 03/20/19 1538  Fetal Heart Rate A  Mode  --  External  Baseline Rate (A)  --  135 bpm  Variability  --  <5 BPM  Accelerations  --  None  Decelerations  --  None  Uterine Activity  Mode  --  Toco  Contraction Frequency (min)  --  4-5  Contraction Duration (sec)  --  50-100  Contraction Quality  --  Mild;Moderate  Resting Tone Palpated  --  Relaxed  Cervical Exam  Dilation 3  --   Effacement (%) 60  --   Cervical Position Posterior  --   Station -2  --   Presentation Vertex  --   Exam by: Elza Rafter rnc  --   Vaginal Bleeding  Vaginal bleeding comment none noted  --   Membranes  Membrane Status Intact  --   OB Interventions  Interventions  --  Position changed  Position  --  Semi-fowlers;Right lateral  Above assessment, to include pt background, called to S Autry-Lott DO who reviewed strip. Orders rec'd to continue to monitor & recheck cervix x 1hr.

## 2019-03-20 NOTE — Progress Notes (Signed)
   03/20/19 1637  Fetal Heart Rate A  Mode External  Baseline Rate (A) 130 bpm  Variability 6-25 BPM  Accelerations 15 x 15  Decelerations None  Uterine Activity  Mode Toco  Contraction Frequency (min) 8-9 with UI  Contraction Duration (sec) 80-130  Contraction Quality Mild  Resting Tone Palpated Relaxed  Cervical Exam  Dilation 3  Effacement (%) 60  Cervical Position Posterior  Cervical Consistency Soft  Vag. Bleeding None  Station Ballotable  Presentation Vertex  Exam by: Elza Rafter rnc  Above assessment called to S Autry-Lott DO.  Orders rec'd to d/c home

## 2019-03-22 ENCOUNTER — Inpatient Hospital Stay (HOSPITAL_COMMUNITY)
Admission: AD | Admit: 2019-03-22 | Discharge: 2019-03-23 | DRG: 807 | Disposition: A | Discharge status: Home / Self Care | Payer: Medicaid Other | Attending: Obstetrics & Gynecology | Admitting: Obstetrics & Gynecology

## 2019-03-22 ENCOUNTER — Other Ambulatory Visit: Payer: Self-pay

## 2019-03-22 ENCOUNTER — Encounter (HOSPITAL_COMMUNITY): Payer: Self-pay | Admitting: *Deleted

## 2019-03-22 DIAGNOSIS — Z87891 Personal history of nicotine dependence: Secondary | ICD-10-CM

## 2019-03-22 DIAGNOSIS — Z20828 Contact with and (suspected) exposure to other viral communicable diseases: Secondary | ICD-10-CM | POA: Diagnosis present

## 2019-03-22 DIAGNOSIS — Z3A37 37 weeks gestation of pregnancy: Secondary | ICD-10-CM

## 2019-03-22 DIAGNOSIS — O26893 Other specified pregnancy related conditions, third trimester: Secondary | ICD-10-CM | POA: Diagnosis present

## 2019-03-22 DIAGNOSIS — O09899 Supervision of other high risk pregnancies, unspecified trimester: Secondary | ICD-10-CM

## 2019-03-22 LAB — CBC
HCT: 39.5 % (ref 36.0–46.0)
Hemoglobin: 13.6 g/dL (ref 12.0–15.0)
MCH: 32.9 pg (ref 26.0–34.0)
MCHC: 34.4 g/dL (ref 30.0–36.0)
MCV: 95.6 fL (ref 80.0–100.0)
Platelets: 264 10*3/uL (ref 150–400)
RBC: 4.13 MIL/uL (ref 3.87–5.11)
RDW: 14.4 % (ref 11.5–15.5)
WBC: 21 10*3/uL — ABNORMAL HIGH (ref 4.0–10.5)
nRBC: 0 % (ref 0.0–0.2)

## 2019-03-22 LAB — TYPE AND SCREEN
ABO/RH(D): AB POS
Antibody Screen: NEGATIVE

## 2019-03-22 LAB — GROUP B STREP BY PCR: Group B strep by PCR: NEGATIVE

## 2019-03-22 LAB — SARS CORONAVIRUS 2 BY RT PCR (HOSPITAL ORDER, PERFORMED IN ~~LOC~~ HOSPITAL LAB): SARS Coronavirus 2: NEGATIVE

## 2019-03-22 LAB — ABO/RH: ABO/RH(D): AB POS

## 2019-03-22 MED ORDER — DIBUCAINE (PERIANAL) 1 % EX OINT: 1.0000 "application " | TOPICAL_OINTMENT | CUTANEOUS | Status: DC | PRN

## 2019-03-22 MED ORDER — ONDANSETRON HCL 4 MG/2ML IJ SOLN: 4.0000 mg | Freq: Four times a day (QID) | INTRAMUSCULAR | Status: DC | PRN

## 2019-03-22 MED ORDER — ACETAMINOPHEN 325 MG PO TABS
650.0000 mg | ORAL_TABLET | ORAL | Status: DC | PRN
  Administered 2019-03-23: 02:00:00 650 mg via ORAL
  Filled 2019-03-22: qty 2

## 2019-03-22 MED ORDER — ACETAMINOPHEN 325 MG PO TABS: 650.0000 mg | ORAL_TABLET | ORAL | Status: DC | PRN

## 2019-03-22 MED ORDER — OXYCODONE-ACETAMINOPHEN 5-325 MG PO TABS: 1.0000 | ORAL_TABLET | ORAL | Status: DC | PRN

## 2019-03-22 MED ORDER — COCONUT OIL OIL: 1.0000 "application " | TOPICAL_OIL | Status: DC | PRN

## 2019-03-22 MED ORDER — PRENATAL MULTIVITAMIN CH
1.0000 | ORAL_TABLET | Freq: Every day | ORAL | Status: DC
  Administered 2019-03-23: 13:00:00 1 via ORAL
  Filled 2019-03-22: qty 1

## 2019-03-22 MED ORDER — OXYTOCIN 40 UNITS IN NORMAL SALINE INFUSION - SIMPLE MED
2.5000 [IU]/h | INTRAVENOUS | Status: DC
  Administered 2019-03-22: 20:00:00 2.5 [IU]/h via INTRAVENOUS
  Filled 2019-03-22: qty 1000

## 2019-03-22 MED ORDER — DIPHENHYDRAMINE HCL 25 MG PO CAPS: 25.0000 mg | ORAL_CAPSULE | Freq: Four times a day (QID) | ORAL | Status: DC | PRN

## 2019-03-22 MED ORDER — OXYCODONE-ACETAMINOPHEN 5-325 MG PO TABS: 2.0000 | ORAL_TABLET | ORAL | Status: DC | PRN

## 2019-03-22 MED ORDER — LACTATED RINGERS IV SOLN: INTRAVENOUS | Status: DC

## 2019-03-22 MED ORDER — LACTATED RINGERS IV SOLN: 500.0000 mL | INTRAVENOUS | Status: DC | PRN

## 2019-03-22 MED ORDER — WITCH HAZEL-GLYCERIN EX PADS: 1.0000 "application " | MEDICATED_PAD | CUTANEOUS | Status: DC | PRN

## 2019-03-22 MED ORDER — SENNOSIDES-DOCUSATE SODIUM 8.6-50 MG PO TABS
2.0000 | ORAL_TABLET | ORAL | Status: DC
  Administered 2019-03-23: 02:00:00 2 via ORAL
  Filled 2019-03-22: qty 2

## 2019-03-22 MED ORDER — ONDANSETRON HCL 4 MG/2ML IJ SOLN
4.0000 mg | INTRAMUSCULAR | Status: DC | PRN
  Filled 2019-03-22: qty 2

## 2019-03-22 MED ORDER — LIDOCAINE HCL (PF) 1 % IJ SOLN
30.0000 mL | INTRAMUSCULAR | Status: AC | PRN
  Administered 2019-03-22: 19:00:00 30 mL via SUBCUTANEOUS
  Filled 2019-03-22: qty 30

## 2019-03-22 MED ORDER — IBUPROFEN 600 MG PO TABS
600.0000 mg | ORAL_TABLET | Freq: Four times a day (QID) | ORAL | Status: DC
  Administered 2019-03-22 – 2019-03-23 (×3): 600 mg via ORAL
  Filled 2019-03-22 (×3): qty 1

## 2019-03-22 MED ORDER — BENZOCAINE-MENTHOL 20-0.5 % EX AERO
1.0000 "application " | INHALATION_SPRAY | CUTANEOUS | Status: DC | PRN
  Administered 2019-03-23: 1 via TOPICAL
  Filled 2019-03-22: qty 56

## 2019-03-22 MED ORDER — OXYTOCIN BOLUS FROM INFUSION
500.0000 mL | Freq: Once | INTRAVENOUS | Status: AC
  Administered 2019-03-22: 19:00:00 500 mL via INTRAVENOUS

## 2019-03-22 MED ORDER — ONDANSETRON HCL 4 MG PO TABS
4.0000 mg | ORAL_TABLET | ORAL | Status: DC | PRN
  Administered 2019-03-22: 22:00:00 4 mg via ORAL
  Filled 2019-03-22: qty 1

## 2019-03-22 MED ORDER — TETANUS-DIPHTH-ACELL PERTUSSIS 5-2.5-18.5 LF-MCG/0.5 IM SUSP
0.5000 mL | Freq: Once | INTRAMUSCULAR | Status: AC
  Administered 2019-03-23: 17:00:00 0.5 mL via INTRAMUSCULAR
  Filled 2019-03-22: qty 0.5

## 2019-03-22 MED ORDER — SOD CITRATE-CITRIC ACID 500-334 MG/5ML PO SOLN: 30.0000 mL | ORAL | Status: DC | PRN

## 2019-03-22 MED ORDER — SIMETHICONE 80 MG PO CHEW: 80.0000 mg | CHEWABLE_TABLET | ORAL | Status: DC | PRN

## 2019-03-22 MED ORDER — ZOLPIDEM TARTRATE 5 MG PO TABS: 5.0000 mg | ORAL_TABLET | Freq: Every evening | ORAL | Status: DC | PRN

## 2019-03-22 NOTE — Discharge Summary (Signed)
OB Discharge Summary     Patient Name: Nichole Hampton DOB: Feb 12, 1986 MRN: [id number]  Date of admission: 03/22/2019 Delivering MD: Marlowe AltSPARACINO, HAILEY L   Date of discharge: 03/23/2019  Admitting diagnosis: CTX  Intrauterine pregnancy: 7327w6d     Secondary diagnosis:  Active Problems:   Labor without complication  Additional problems: intermittent prenatal care, depression (not on meds), LGSIL     Discharge diagnosis: Term Pregnancy Delivered                                                                                                Post partum procedures:none  Augmentation: none  Complications: None  Hospital course:  Onset of Labor With Vaginal Delivery     33 y.o. yo [id number] at 6827w6d was admitted in Active Labor on 03/22/2019. Patient had an uncomplicated labor course as follows:  Membrane Rupture Time/Date: 6:58 PM ,03/22/2019   Intrapartum Procedures: Episiotomy: None [1]                                         Lacerations:  1st degree [2];Perineal [11]  Patient had a delivery of a Viable infant. 03/22/2019  Information for the patient's newborn:  Everlean AlstromMorris, Boy Sanela [[id number]][[id number]]  Delivery Method: Vaginal, Spontaneous(Filed from Delivery Summary)     Pateint had an uncomplicated postpartum course.  She is ambulating, tolerating a regular diet, passing flatus, and urinating well. Patient is discharged home in stable condition on 03/23/19.   Physical exam  Vitals:   03/22/19 2300 03/23/19 0200 03/23/19 0550 03/23/19 1030  BP:  114/73 129/75 131/80  Pulse:  72 (!) 58 (!) 59  Resp:  20 16 18   Temp:  98.2 F (36.8 C) 98.3 F (36.8 C) 98.6 F (37 C)  TempSrc:  Oral Oral Oral  SpO2:  100% 99% 99%  Weight: 65.2 kg     Height: 5\' 2"  (1.575 m)      General: alert, cooperative and no distress Lochia: appropriate Uterine Fundus: firm Incision: N/A DVT Evaluation: No evidence of DVT seen on physical exam. Labs: Lab Results  Component Value Date   WBC 17.7 (H)  03/23/2019   HGB 11.7 (L) 03/23/2019   HCT 34.0 (L) 03/23/2019   MCV 95.2 03/23/2019   PLT 233 03/23/2019   No flowsheet data found.  Discharge instruction: per After Visit Summary and "Baby and Me Booklet".  After visit meds:  Allergies as of 03/23/2019   No Known Allergies     Medication List    STOP taking these medications   Comfort Fit Maternity Supp Lg Misc   famotidine 20 MG tablet Commonly known as: Pepcid   ondansetron 4 MG tablet Commonly known as: ZOFRAN   promethazine 25 MG tablet Commonly known as: PHENERGAN     TAKE these medications   BENADRYL PO Take by mouth.   ibuprofen 600 MG tablet Commonly known as: ADVIL Take 1 tablet (600 mg total) by mouth every 6 (six) hours.   prenatal multivitamin Tabs  tablet Take 1 tablet by mouth daily at 12 noon.   senna-docusate 8.6-50 MG tablet Commonly known as: Senokot-S Take 2 tablets by mouth daily. Start taking on: March 24, 2019   Tums 500 MG chewable tablet Generic drug: calcium carbonate Chew 1 tablet by mouth daily.       Diet: routine diet  Activity: Advance as tolerated. Pelvic rest for 6 weeks.   Outpatient follow up:4 weeks Follow up Appt:No future appointments. Follow up Visit:No follow-ups on file.  Postpartum contraception: Vasectomy  Newborn Data: Live born female  Birth Weight: 6 lb 2.9 oz (2805 g) APGAR: 9, 9  Newborn Delivery   Birth date/time: 03/22/2019 19:10:00 Delivery type: Vaginal, Spontaneous      Baby Feeding: Both Disposition:home with mother   03/23/2019 Chauncey Mann, MD

## 2019-03-22 NOTE — MAU Note (Signed)
@  2878 called Clemetine Marker to give report, room 215 assigned

## 2019-03-22 NOTE — MAU Note (Signed)
Pt presents with c/o ctxs since 1000 this morning.  Denies LOF, reports bloody show.  Endorses +FM.
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
## 2019-03-23 ENCOUNTER — Ambulatory Visit: Payer: Self-pay

## 2019-03-23 LAB — CBC
HCT: 34 % — ABNORMAL LOW (ref 36.0–46.0)
Hemoglobin: 11.7 g/dL — ABNORMAL LOW (ref 12.0–15.0)
MCH: 32.8 pg (ref 26.0–34.0)
MCHC: 34.4 g/dL (ref 30.0–36.0)
MCV: 95.2 fL (ref 80.0–100.0)
Platelets: 233 10*3/uL (ref 150–400)
RBC: 3.57 MIL/uL — ABNORMAL LOW (ref 3.87–5.11)
RDW: 14.3 % (ref 11.5–15.5)
WBC: 17.7 10*3/uL — ABNORMAL HIGH (ref 4.0–10.5)
nRBC: 0 % (ref 0.0–0.2)

## 2019-03-23 LAB — RPR: RPR Ser Ql: NONREACTIVE

## 2019-03-23 MED ORDER — SENNOSIDES-DOCUSATE SODIUM 8.6-50 MG PO TABS: 2.0000 | ORAL_TABLET | ORAL | 0 refills | Status: DC

## 2019-03-23 MED ORDER — IBUPROFEN 600 MG PO TABS: 600.0000 mg | ORAL_TABLET | Freq: Four times a day (QID) | ORAL | 0 refills | Status: DC
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
## 2019-03-23 NOTE — Lactation Note (Signed)
This note was copied from a baby's chart. Lactation Consultation Note  Patient Name: Boy Merril Isakson [id number] Date: 03/23/2019     RN had called LC about infant not feeding well.  [place] visited mom to explore her desires to breastfeed and bottle feed. At this time, mom states she wants to bottle feed formula so she knows how much infant is getting.  She says she will pump and provide some breastmilk.  LC offered to set up DEBP. Mother declined and said she would begin pumping at home.  She is enrolling with WIC.  She currently does not have a breast pump.    Mom discussed infant falling asleep at breast; LC offered to assist with latch.  Mother declined.  Father of infant has gone to get their own bottles to "try" those. Infant fed 7 ml recently but would not take more.  Mom states he is gassy and burping a lot.  LC offered to assist with bottle feed but infant would not suck.  LC tried to assess suck with gloved finger but infant would not suck.    LC will discuss with RN option of using purple nipple for next feed.    LC told mother she needs to call out for RN if infant does not eat/ suck for next feed.     Maternal Data    Feeding Feeding Type: Bottle Fed - Formula Nipple Type: Slow - flow  LATCH Score                   Interventions    Lactation Tools Discussed/Used     Consult Status      Ferne Coe [facility] 03/23/2019, 6:21 PM

## 2019-03-23 NOTE — Progress Notes (Signed)
POSTPARTUM PROGRESS NOTE  Post Partum Day 1  Subjective:  Nichole Hampton is a 33 y.o. Q2I2979 s/p SVD at [redacted]w[redacted]d.  No acute events overnight.  Pt denies problems with ambulating, voiding or po intake.  Her nausea is controlled with Zofran. She denies vomiting.  Pain is well controlled.  She has had flatus. She has had bowel movement.  Lochia Minimal.  Objective: Blood pressure 129/75, pulse (!) 58, temperature 98.3 F (36.8 C), temperature source Oral, resp. rate 16, height 5\' 2"  (1.575 m), weight 65.2 kg, last menstrual period 06/22/2018, SpO2 99 %, unknown if currently breastfeeding.  Physical Exam:  General: alert, cooperative and no distress Chest: no respiratory distress Heart:regular rate, distal pulses intact Abdomen: soft, nontender Uterine Fundus: firm, appropriately tender DVT Evaluation: No calf swelling or tenderness, negative Homan Extremities: no edema Skin: warm, dry; incision clean/dry/intact  Recent Labs    03/22/19 1852 03/23/19 0545  HGB 13.6 11.7*  HCT 39.5 34.0*    Assessment/Plan: Nichole Hampton is a 33 y.o. G9Q1194 s/p SVD at [redacted]w[redacted]d   PPD#1 - Doing well Contraception: Depo-inpatient, partner plans to get vasectomy but has not scheduled appointment  Feeding: breast and bottle Dispo: Plan for discharge tonight.    LOS: 1 day   Orland Penman, [place] student Ginette Otto, [place] 03/23/2019, 9:08 AM

## 2019-03-23 NOTE — Lactation Note (Signed)
This note was copied from a baby's chart. Lactation Consultation Note Baby 5 hrs old. Mom is breast/formula feeding. Mom stated baby is feeding well. He isn't that hungry. Mom is giving formula after BF. Mom stated she daughter wouldn't latch so she didn't BF her. Mom has everted nipples. Mom demonstrated hand expression. Newborn behavior, STS, I&O, supply and demand discussed. Mom encouraged to feed baby 8-12 times/24 hours and with feeding cues. Mom encouraged to waken baby for feeding if hasn't cued in 3 hrs. Encouraged mom to BF first before formula feeding. Encouraged to call for assistance or questions. Lactation brochure given.  Patient Name: Nichole Hampton [id number] Date: 03/23/2019 Reason for consult: Initial assessment;Early term 37-38.6wks;1st time breastfeeding(mom stated her first child wouldn't latch.)   Maternal Data Has patient been taught Hand Expression?: Yes Does the patient have breastfeeding experience prior to this delivery?: No  Feeding Feeding Type: Bottle Fed - Formula Nipple Type: Slow - flow  LATCH Score       Type of Nipple: Everted at rest and after stimulation  Comfort (Breast/Nipple): Soft / non-tender        Interventions Interventions: Breast feeding basics reviewed;Hand express  Lactation Tools Discussed/Used WIC Program: No   Consult Status Consult Status: Follow-up Date: 03/24/19 Follow-up type: In-patient    Theodoro Kalata 03/23/2019, 12:43 AM

## 2019-04-26 ENCOUNTER — Other Ambulatory Visit: Payer: Self-pay

## 2019-04-26 ENCOUNTER — Telehealth: Payer: Self-pay | Admitting: Medical

## 2019-04-26 ENCOUNTER — Encounter: Payer: Self-pay | Admitting: Medical

## 2019-04-26 ENCOUNTER — Encounter: Payer: Medicaid Other | Admitting: Medical

## 2019-04-26 NOTE — Progress Notes (Signed)
11:42a- Called pt for My Chart visit, no answer, left VM that will retry in 10 min.   11:51a- 2nd attempt , no answer, pt will have to reschedule.left VM.

## 2019-04-26 NOTE — Patient Instructions (Signed)
Please reschedule your appointment  

## 2019-04-26 NOTE — Telephone Encounter (Signed)
Attempted to call patient about her missed appointment on 9/6. No answer, left voicemail for patient to give the office a call back to rescheduled. No show letter mailed

## 2019-07-05 IMAGING — US US OB < 14 WEEKS - US OB TV
1 series · 15 of 28 positions shown · non-contrast
Comparison: None for this gestation

Addendum:
CLINICAL DATA: Pregnancy of inconclusive fetal viability, history
of multiple miscarriages; EGA 12 weeks 3 days based on LMP of
05/22/2018

EXAM:
OBSTETRIC <14 WK US AND TRANSVAGINAL OB US
TECHNIQUE: Both transabdominal and transvaginal ultrasound examinations were
performed for complete evaluation of the gestation as well as the
maternal uterus, adnexal regions, and pelvic cul-de-sac.
Transvaginal technique was performed to assess early pregnancy.

[Series 1: us ob < 14 weeks - us ob tv · 15 of 44 slices shown]
[im 1/44]
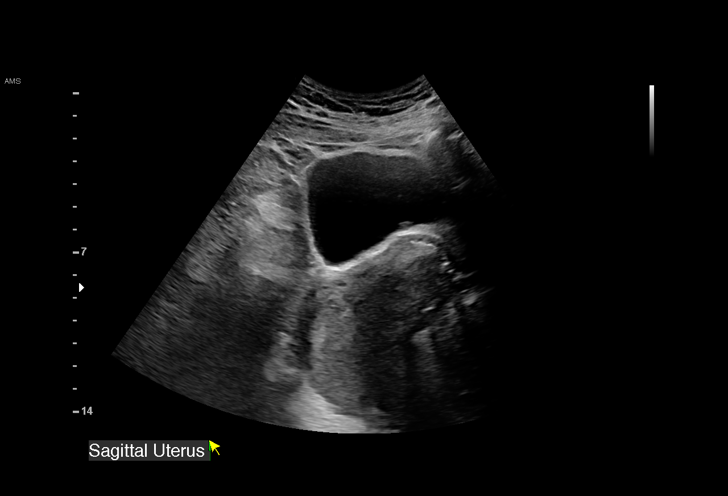
[im 4/44]
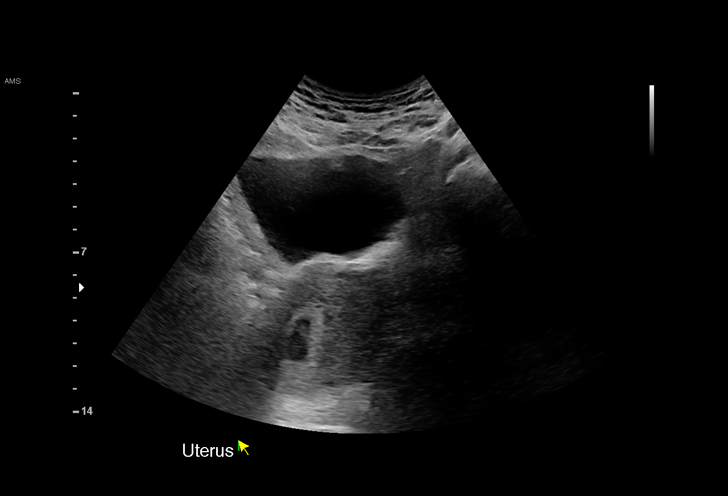
[im 7/44]
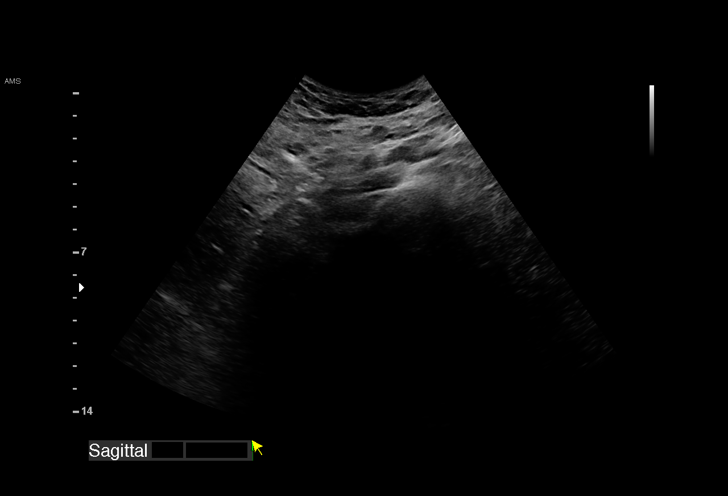
[im 10/44]
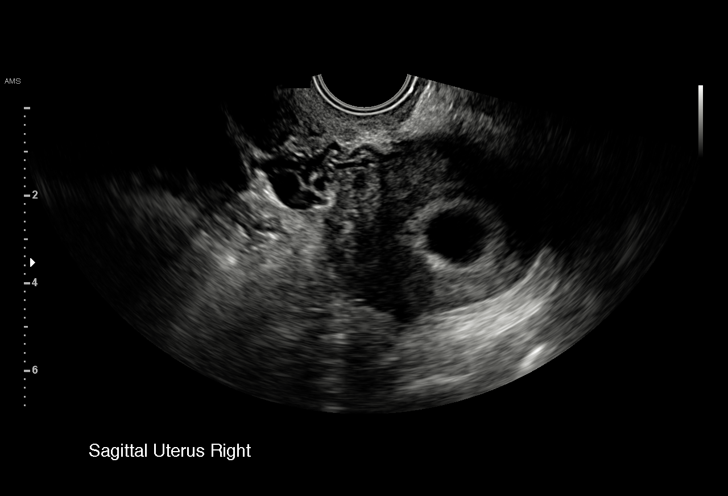
[im 13/44]
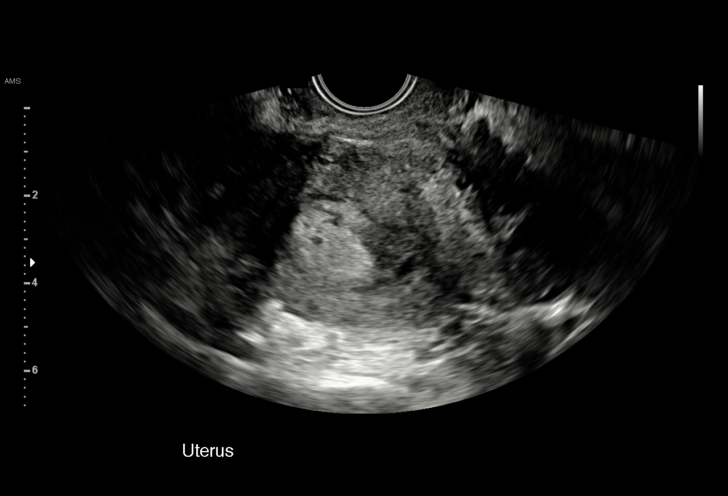
[im 16/44]
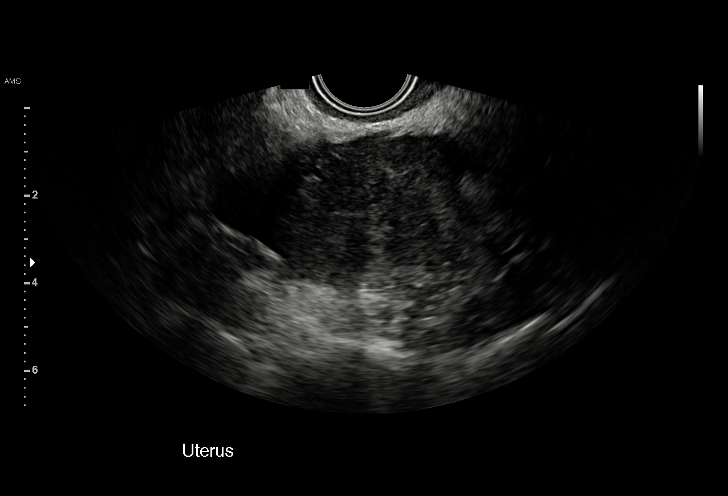
[im 20/44]
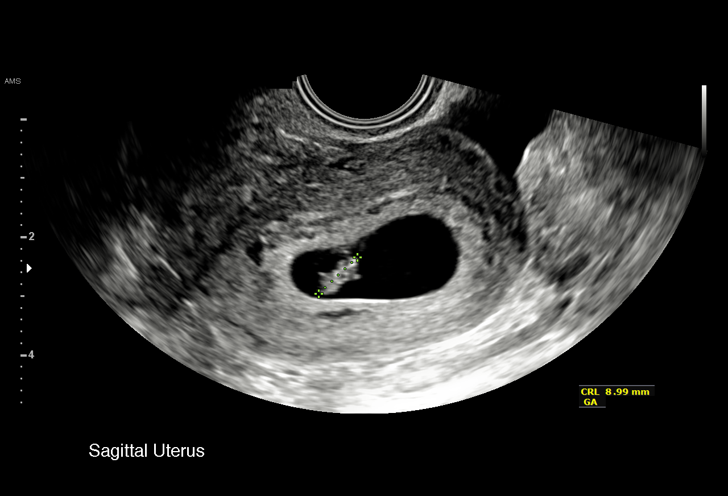
[im 23/44]
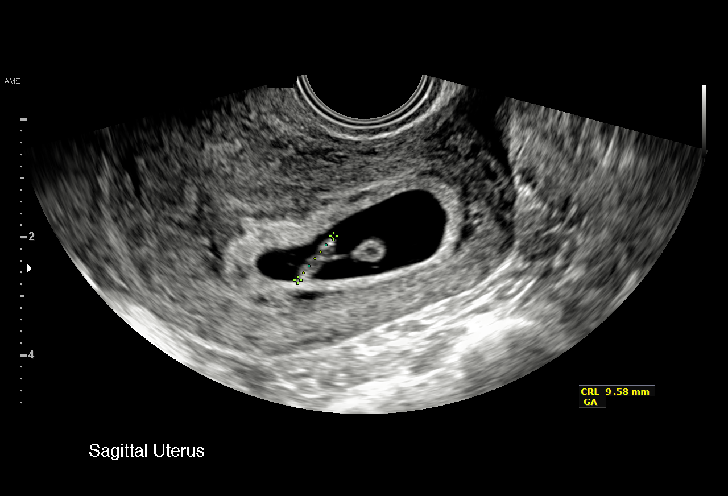
[im 24/44]
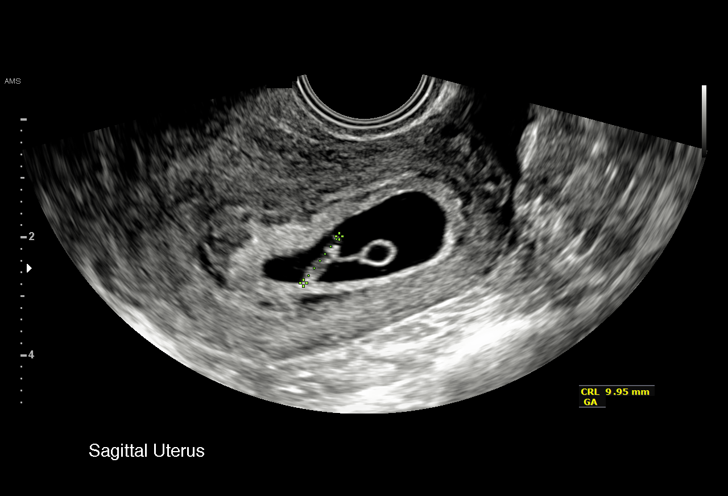
[im 28/44]
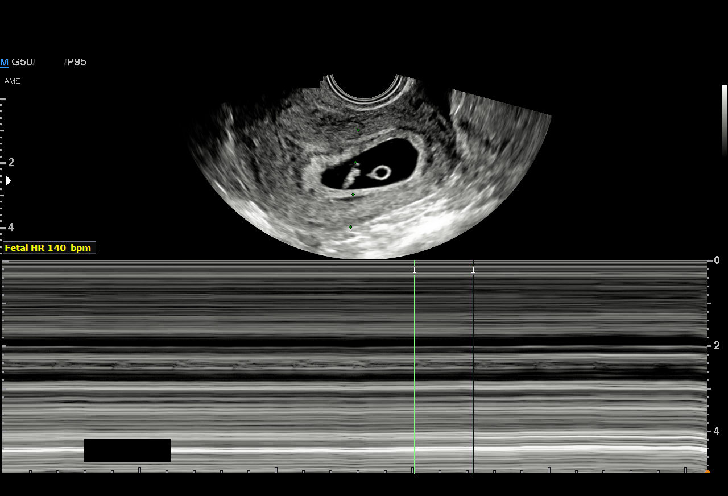
[im 31/44]
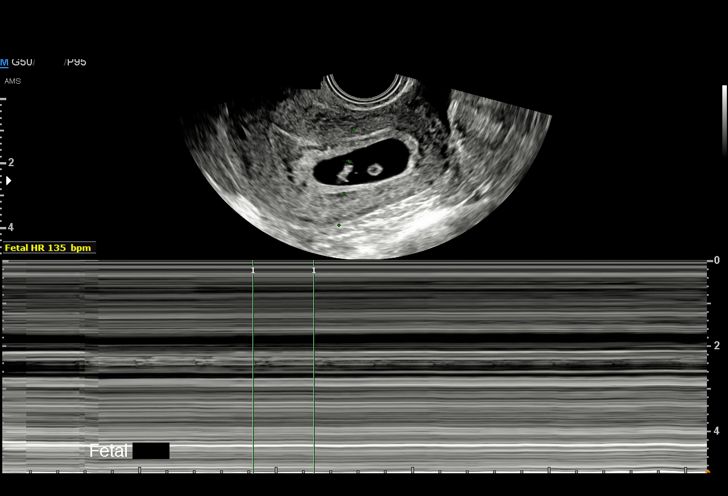
[im 34/44]
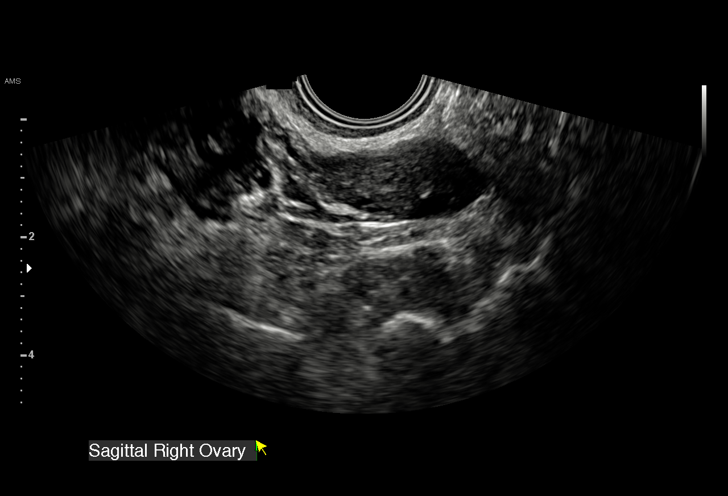
[im 37/44]
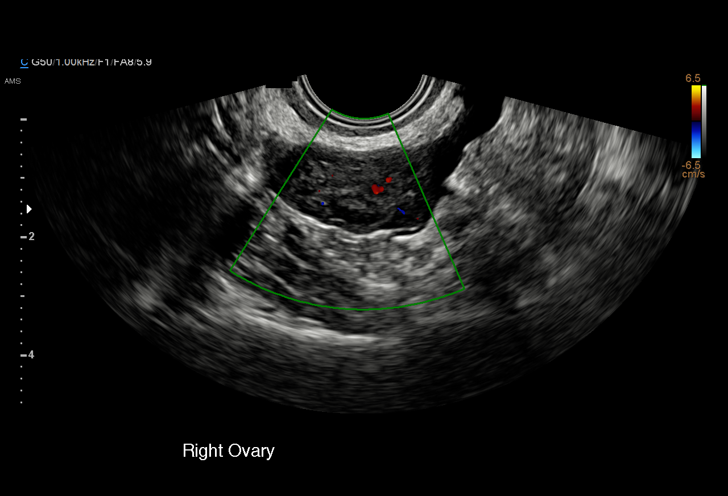
[im 40/44]
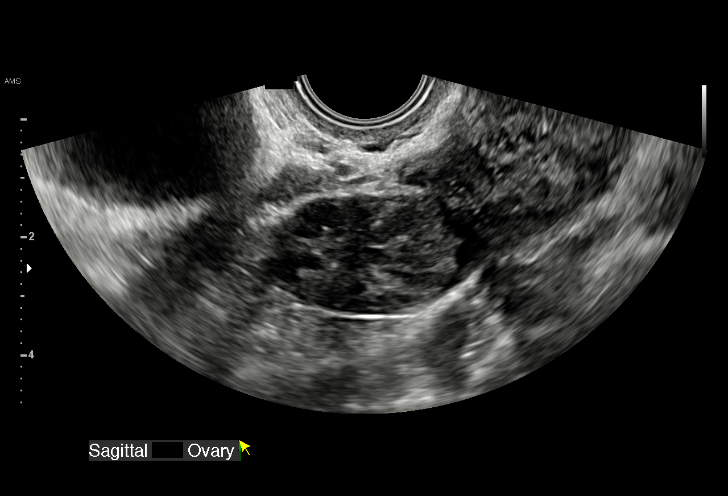
[im 44/44]
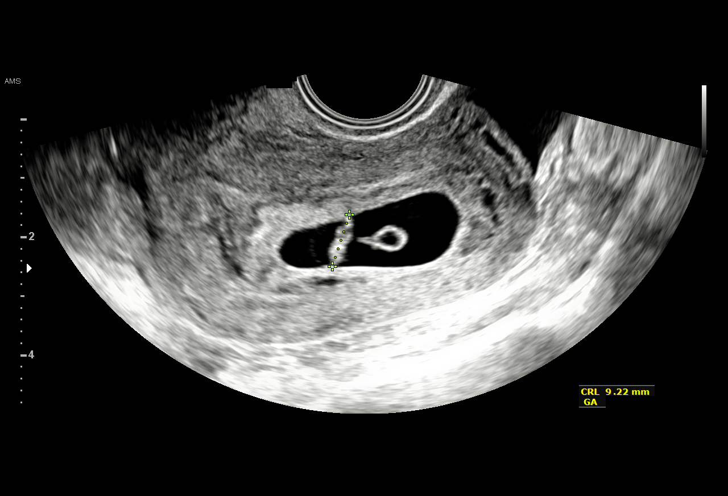

[15 of 28 positions shown; findings below may reference images not displayed]

FINDINGS: Intrauterine gestational sac: Present, single

Yolk sac:  Present

Embryo:  Present

Cardiac Activity: Present

Heart Rate: 136 bpm

CRL:  9.3 mm   6 w   6 d                  US EDC: 04/06/2019

Subchorionic hemorrhage:  None identified

Maternal uterus/adnexae:

Retroverted uterus.

No focal uterine mass seen.

Ovaries normal appearance bilaterally corpus luteum in LEFT ovary.

No free pelvic fluid or adnexal masses.
IMPRESSION: Single live intrauterine gestation at 9 weeks 3 days EGA by
crown-rump length.

No acute abnormalities.

ADDENDUM:
Correction to impression.

Impression should state:

Single live intrauterine gestation with a crown-rump length of
mm.

This corresponds to an EGA of 6 weeks 6 days.

No acute abnormalities.

*** End of Addendum ***

## 2019-10-11 IMAGING — US US MFM OB DETAIL +14 WK
1 series · 13 of 28 positions shown · non-contrast
Comparison: none

[Series 1: us mfm ob detail +14 wk · 105 acquisitions, 13 frames shown]
[im 4/105]
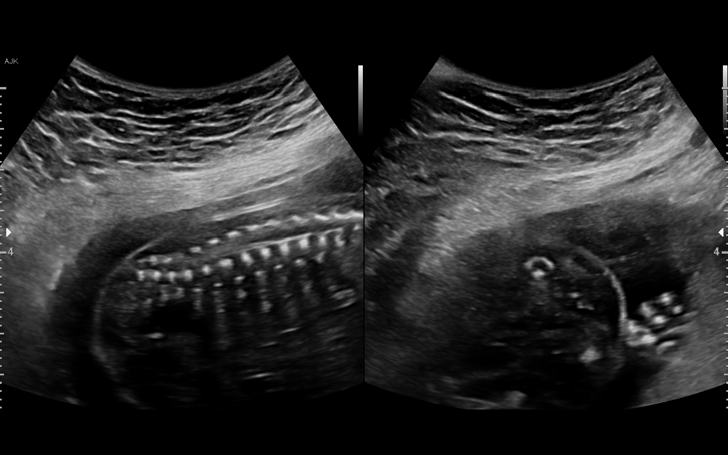
[im 12/105]
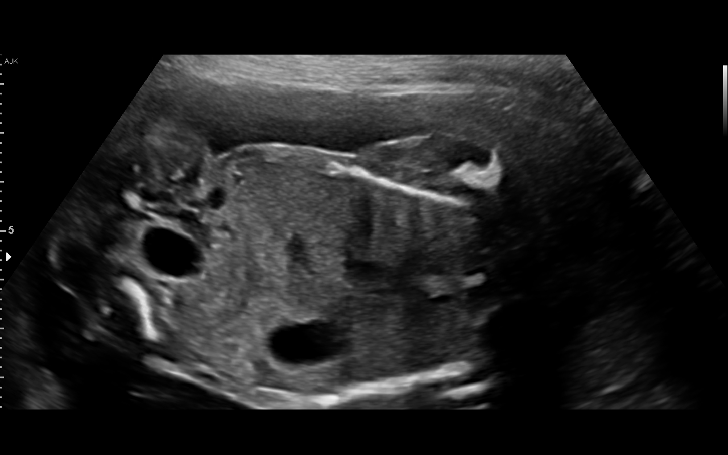
[im 20/105]
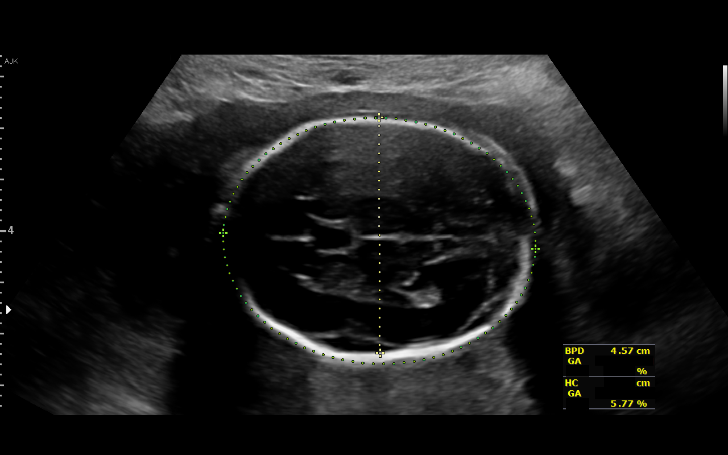
[im 27/105]
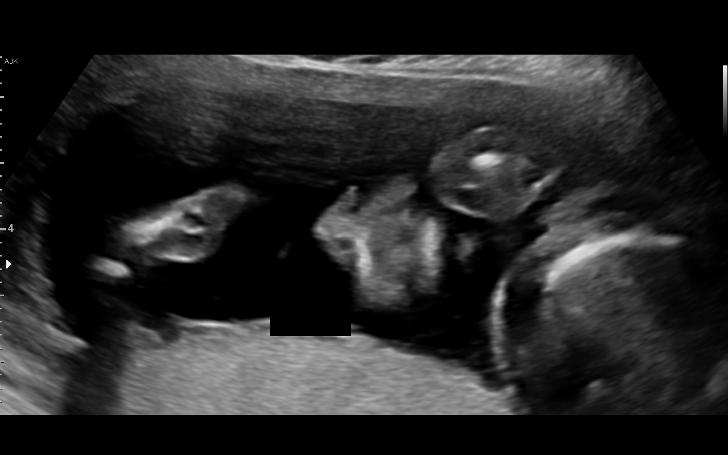
[im 35/105]
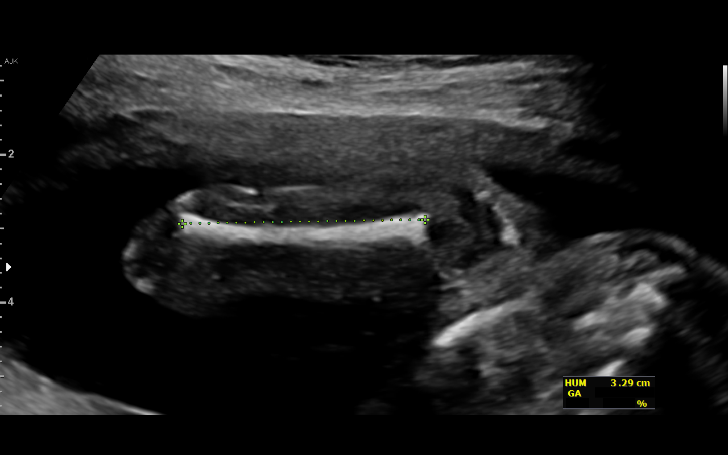
[im 43/105]
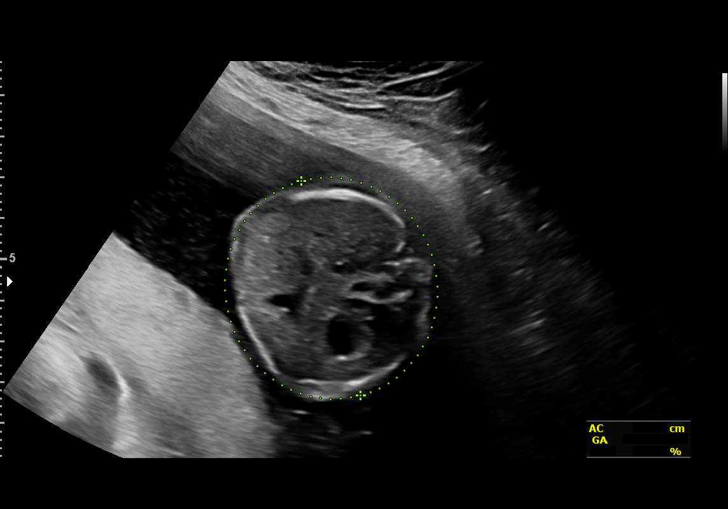
[im 54/105]
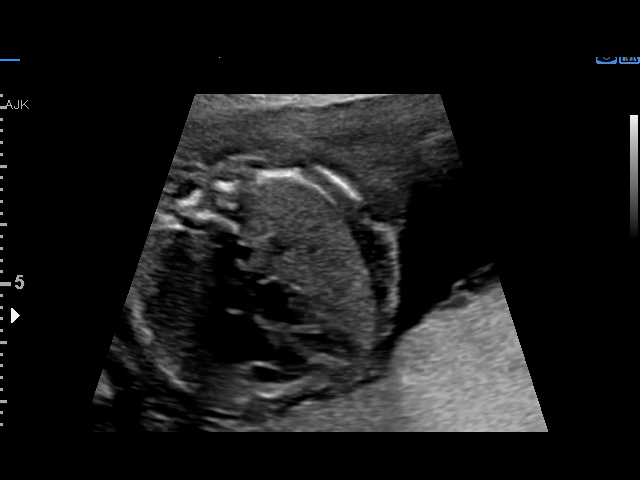
[im 62/105]
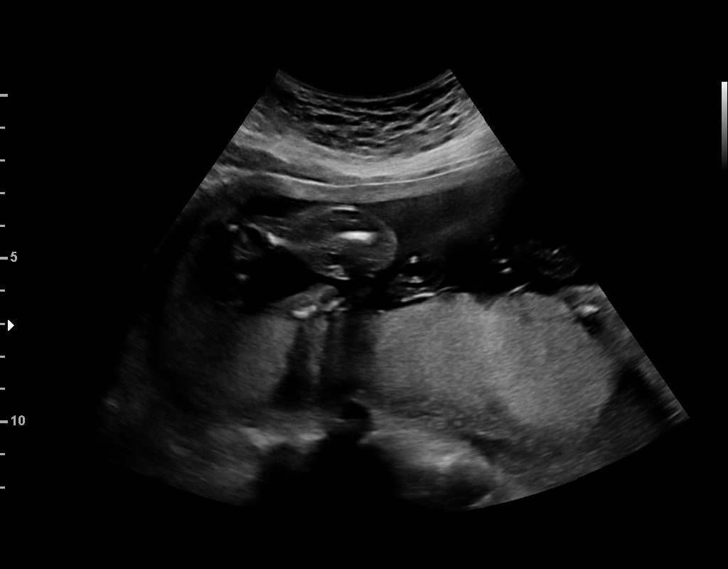
[im 70/105]
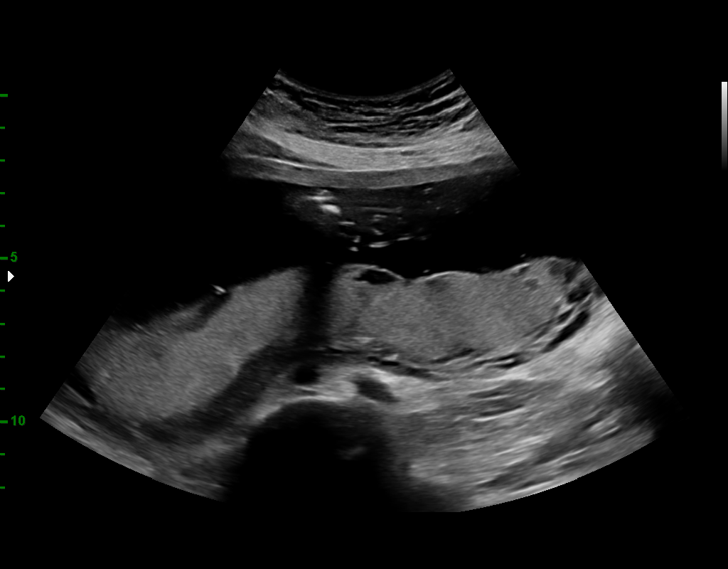
[im 78/105]
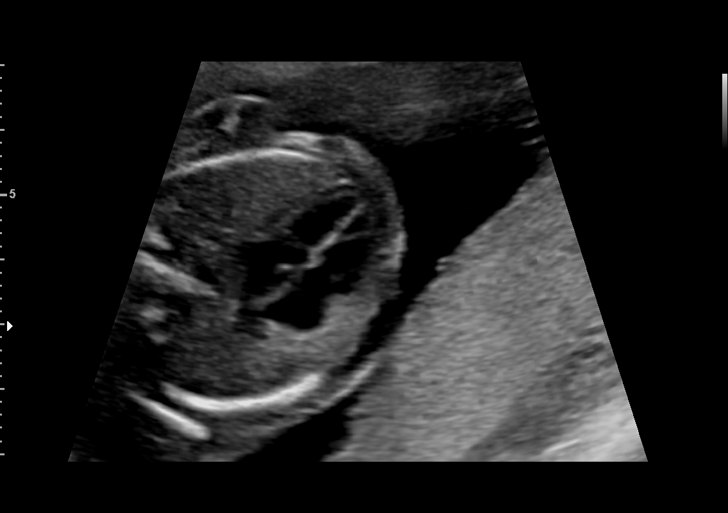
[im 85/105]
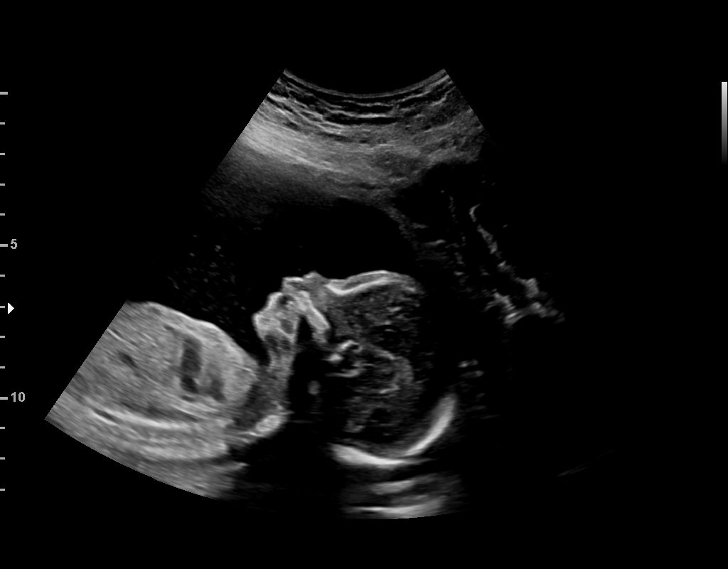
[im 93/105]
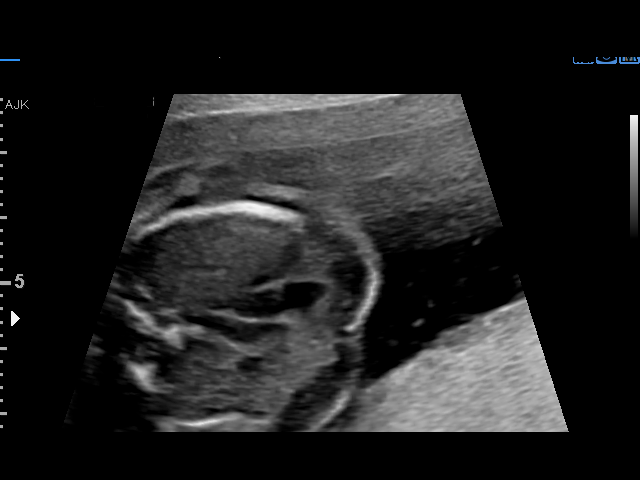
[im 101/105]
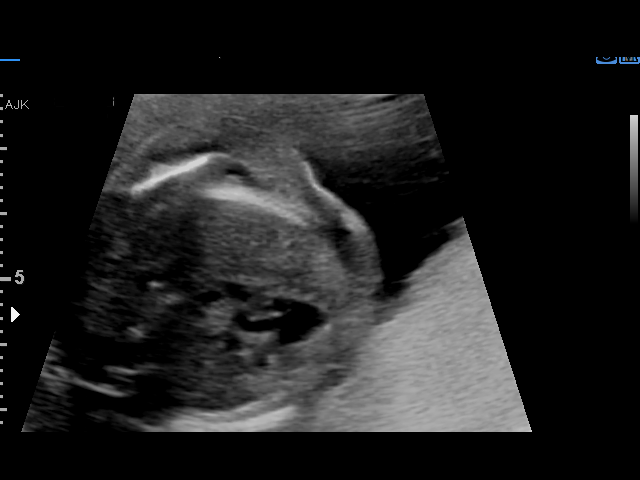

[13 of 28 positions shown; findings below may reference images not displayed]

----------------------------------------------------------------------

 ----------------------------------------------------------------------
Indications

  Poor obstetric history-Recurrent (habitual)
  abortion (3 consecutive ab's)
  Encounter for antenatal screening for
  malformations (low risk nips, neg AFP)
  20 weeks gestation of pregnancy
 ----------------------------------------------------------------------
Vital Signs

 BMI:
Fetal Evaluation

 Num Of Fetuses:         1
 Fetal Heart Rate(bpm):  150
 Cardiac Activity:       Observed
 Presentation:           Variable
 Placenta:               Posterior
 P. Cord Insertion:      Visualized

 Amniotic Fluid
 AFI FV:      Within normal limits

                             Largest Pocket(cm)

Biometry

 BPD:      46.3  mm     G. Age:  20w 0d         17  %    CI:        73.79   %    70 - 86
                                                         FL/HC:      19.2   %    15.9 -
 HC:      171.2  mm     G. Age:  19w 5d          6  %    HC/AC:      1.04        1.06 -
 AC:      165.4  mm     G. Age:  21w 4d         67  %    FL/BPD:     70.8   %
 FL:       32.8  mm     G. Age:  20w 2d         22  %    FL/AC:      19.8   %    20 - 24
 HUM:      32.9  mm     G. Age:  21w 0d         54  %
 CER:      21.4  mm     G. Age:  20w 3d         40  %
 NFT:       5.3  mm

 LV:        4.8  mm
 CM:        4.9  mm

 Est. FW:     379  gm    0 lb 13 oz      44  %
OB History

 Gravidity:    5         Term:   1         SAB:   3
 TOP:          0       Ectopic:  0        Living: 1
Gestational Age

 LMP:           22w 0d        Date:  06/22/18                 EDD:   03/29/19
 U/S Today:     20w 3d                                        EDD:   04/09/19
 Best:          20w 6d     Det. By:  Previous Ultrasound      EDD:   04/06/19
                                     (08/17/18)
Anatomy

 Cranium:               Appears normal         Aortic Arch:            Appears normal
 Cavum:                 Appears normal         Ductal Arch:            Appears normal
 Ventricles:            Appears normal         Diaphragm:              Appears normal
 Choroid Plexus:        Appears normal         Stomach:                Appears normal, left
                                                                       sided
 Cerebellum:            Appears normal         Abdomen:                Appears normal
 Posterior Fossa:       Appears normal         Abdominal Wall:         Appears nml (cord
                                                                       insert, abd wall)
 Nuchal Fold:           Appears normal         Cord Vessels:           Appears normal (3
                                                                       vessel cord)
 Face:                  Appears normal         Kidneys:                Appear normal
                        (orbits and profile)
 Lips:                  Appears normal         Bladder:                Appears normal
 Thoracic:              Appears normal         Spine:                  Appears normal
 Heart:                 Appears normal         Upper Extremities:      Appears normal
                        (4CH, axis, and
                        situs)
 RVOT:                  Appears normal         Lower Extremities:      Appears normal
 LVOT:                  Appears normal

 Other:  Fetus appears to be a male. Heels and 5th digit visualized. Nasal
         bone visualized. Technically difficult due to fetal position.
Cervix Uterus Adnexa

 Cervix
 Not adaquately visualized

 Uterus
 No abnormality visualized.

 Left Ovary
 Not visualized.

 Right Ovary
 Not visualized.
 Adnexa
 No abnormality visualized.
Impression

 Normal interval growth.  No ultrasonic evidence of structural
 fetal anomalies.
 Low risk NIPS
Recommendations

 Follow up as clinically indicated.

## 2023-02-25 ENCOUNTER — Ambulatory Visit: Payer: Medicaid Other | Admitting: Family Medicine

## 2023-02-25 ENCOUNTER — Other Ambulatory Visit: Payer: Self-pay

## 2023-02-25 VITALS — BP 116/73 | HR 88 | Ht 62.0 in | Wt 165.0 lb

## 2023-02-25 DIAGNOSIS — Z3490 Encounter for supervision of normal pregnancy, unspecified, unspecified trimester: Secondary | ICD-10-CM

## 2023-02-25 DIAGNOSIS — Z3200 Encounter for pregnancy test, result unknown: Secondary | ICD-10-CM

## 2023-02-25 LAB — POCT PREGNANCY, URINE: Preg Test, Ur: POSITIVE — AB
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
## 2023-02-25 NOTE — Progress Notes (Signed)
Pt presents for urine pregnancy test which is positive.  She reports that she has irregular periods and unsure LMP was 12/18/22. She denies vaginal bleeding or abdominal pain. Pt was advised to go to [facility] if these sx occur. [place] for viability and dating scheduled on 7/22 @ 0945. Dr. Crissie Reese in to meet with pt. New Ob appts will be scheduled. Pt voiced understanding of all information and instructions given.

## 2023-03-01 ENCOUNTER — Ambulatory Visit (INDEPENDENT_AMBULATORY_CARE_PROVIDER_SITE_OTHER): Payer: Medicaid Other

## 2023-03-01 ENCOUNTER — Telehealth: Payer: Self-pay

## 2023-03-01 DIAGNOSIS — Z3490 Encounter for supervision of normal pregnancy, unspecified, unspecified trimester: Secondary | ICD-10-CM

## 2023-03-01 DIAGNOSIS — Z3491 Encounter for supervision of normal pregnancy, unspecified, first trimester: Secondary | ICD-10-CM

## 2023-03-01 DIAGNOSIS — Z3A01 Less than 8 weeks gestation of pregnancy: Secondary | ICD-10-CM

## 2023-03-01 NOTE — Telephone Encounter (Signed)
Dating US performed this AM that showed small gestational sac. Patient denies pain or bleeding. Reports first positive UPT on 02/19/23. Reviewed with Anyanwu MD who recommends repeat US in 3 weeks for dating. Called pt and recommendation provided; appt scheduled for 03/22/23. Will need to move new OB appts to follow upcoming dating [place]. Front office notified.

## 2023-03-16 ENCOUNTER — Telehealth: Payer: Medicaid Other

## 2023-03-22 ENCOUNTER — Ambulatory Visit (INDEPENDENT_AMBULATORY_CARE_PROVIDER_SITE_OTHER): Payer: Medicaid Other | Admitting: Obstetrics and Gynecology

## 2023-03-22 ENCOUNTER — Ambulatory Visit: Payer: Medicaid Other

## 2023-03-22 DIAGNOSIS — Z3490 Encounter for supervision of normal pregnancy, unspecified, unspecified trimester: Secondary | ICD-10-CM

## 2023-03-22 DIAGNOSIS — Z3A01 Less than 8 weeks gestation of pregnancy: Secondary | ICD-10-CM

## 2023-03-22 DIAGNOSIS — Z3491 Encounter for supervision of normal pregnancy, unspecified, first trimester: Secondary | ICD-10-CM

## 2023-03-22 DIAGNOSIS — O039 Complete or unspecified spontaneous abortion without complication: Secondary | ICD-10-CM

## 2023-03-22 DIAGNOSIS — Z3A08 8 weeks gestation of pregnancy: Secondary | ICD-10-CM

## 2023-03-22 MED ORDER — MISOPROSTOL 200 MCG PO TABS: ORAL_TABLET | ORAL | 1 refills | Status: AC

## 2023-03-22 MED ORDER — ONDANSETRON 4 MG PO TBDP: 4.0000 mg | ORAL_TABLET | Freq: Four times a day (QID) | ORAL | 0 refills | Status: AC | PRN

## 2023-03-22 MED ORDER — OXYCODONE-ACETAMINOPHEN 5-325 MG PO TABS: 1.0000 | ORAL_TABLET | ORAL | 0 refills | Status: AC | PRN

## 2023-03-22 NOTE — Progress Notes (Addendum)
   GYNECOLOGY PROGRESS NOTE  History:  37 y.o. [id number] presents to [facility] office today for ultrasound. She had a positive UPT 7/12. 7/22 dating u/s which showed IUP, no yolk sac or fetal pole visualized. Scheduled for repeat u/s today. She denies bleeding, cramping. She denies h/a, dizziness, shortness of breath, n/v, or fever/chills.     Health Maintenance Due  Topic Date Due   Hepatitis C Screening  Never done   PAP SMEAR-Modifier  09/08/2021   COVID-19 Vaccine (1 - 2023-24 season) Never done   INFLUENZA VACCINE  03/11/2023     Review of Systems:  Pertinent items are noted in HPI.   Objective:  Physical Exam Last menstrual period 12/18/2022. VS reviewed, nursing note reviewed,  Constitutional: well developed, well nourished, no distress HEENT: normocephalic Pulm/chest wall: normal effort Neuro: alert and oriented  Skin: warm, dry Psych: affect normal Pelvic exam: deferred  Assessment & Plan:  1. Spontaneous miscarriage Discussed with MD. Reviewed u/s results, no fetal heart tones at 13w.  Expressed sincere apologies  Discussed expectant management vs medication management. She would like medication management. Discussed bloodwork and what to expect once start medication. Offered antiemetic and pain short course pain management.  Return precautions including crescendo abdominal pain, heavy vaginal bleeding soaking >1 pad/hour, and fever go to hospital.   Is returning on 8/14 for initial blood work then will start medication - CBC - Beta hCG quant (ref lab)   Future Appointments  Date Time Provider Department Center  03/24/2023  2:10 PM [facility]     Albertine Grates, FNP

## 2023-03-23 ENCOUNTER — Encounter: Payer: Medicaid Other | Admitting: Obstetrics and Gynecology

## 2023-03-24 ENCOUNTER — Other Ambulatory Visit: Payer: Medicaid Other

## 2023-03-24 DIAGNOSIS — O039 Complete or unspecified spontaneous abortion without complication: Secondary | ICD-10-CM

## 2023-03-25 ENCOUNTER — Telehealth: Payer: Self-pay | Admitting: Obstetrics and Gynecology

## 2023-03-25 LAB — CBC
Hematocrit: 36.3 % (ref 34.0–46.6)
Hemoglobin: 12.2 g/dL (ref 11.1–15.9)
MCH: 32 pg (ref 26.6–33.0)
MCHC: 33.6 g/dL (ref 31.5–35.7)
MCV: 95 fL (ref 79–97)
Platelets: 272 10*3/uL (ref 150–450)
RBC: 3.81 x10E6/uL (ref 3.77–5.28)
RDW: 11.8 % (ref 11.7–15.4)
WBC: 6.6 10*3/uL (ref 3.4–10.8)

## 2023-03-25 LAB — BETA HCG QUANT (REF LAB): hCG Quant: 9345 m[IU]/mL

## 2023-03-26 ENCOUNTER — Telehealth: Payer: Self-pay | Admitting: Obstetrics and Gynecology

## 2023-03-26 NOTE — Telephone Encounter (Signed)
Phone call

## 2023-03-27 NOTE — Telephone Encounter (Signed)
Opened in error

## 2023-11-09 ENCOUNTER — Emergency Department (HOSPITAL_COMMUNITY)
Admission: EM | Admit: 2023-11-09 | Discharge: 2023-11-09 | Discharge status: Against Medical Advice | Attending: Emergency Medicine | Admitting: Emergency Medicine

## 2023-11-09 ENCOUNTER — Other Ambulatory Visit: Payer: Self-pay

## 2023-11-09 ENCOUNTER — Encounter (HOSPITAL_COMMUNITY): Payer: Self-pay

## 2023-11-09 DIAGNOSIS — R109 Unspecified abdominal pain: Secondary | ICD-10-CM | POA: Diagnosis present

## 2023-11-09 DIAGNOSIS — R1031 Right lower quadrant pain: Secondary | ICD-10-CM | POA: Insufficient documentation

## 2023-11-09 DIAGNOSIS — R030 Elevated blood-pressure reading, without diagnosis of hypertension: Secondary | ICD-10-CM | POA: Diagnosis not present

## 2023-11-09 DIAGNOSIS — Z5329 Procedure and treatment not carried out because of patient's decision for other reasons: Secondary | ICD-10-CM | POA: Diagnosis not present

## 2023-11-09 LAB — CBC
HCT: 43.1 % (ref 36.0–46.0)
Hemoglobin: 13.9 g/dL (ref 12.0–15.0)
MCH: 31.7 pg (ref 26.0–34.0)
MCHC: 32.3 g/dL (ref 30.0–36.0)
MCV: 98.4 fL (ref 80.0–100.0)
Platelets: 260 10*3/uL (ref 150–400)
RBC: 4.38 MIL/uL (ref 3.87–5.11)
RDW: 12.8 % (ref 11.5–15.5)
WBC: 6.9 10*3/uL (ref 4.0–10.5)
nRBC: 0 % (ref 0.0–0.2)

## 2023-11-09 LAB — LIPASE, BLOOD: Lipase: 32 U/L (ref 11–51)

## 2023-11-09 LAB — COMPREHENSIVE METABOLIC PANEL WITH GFR
ALT: 22 U/L (ref 0–44)
AST: 23 U/L (ref 15–41)
Albumin: 4.4 g/dL (ref 3.5–5.0)
Alkaline Phosphatase: 56 U/L (ref 38–126)
Anion gap: 9 (ref 5–15)
BUN: 8 mg/dL (ref 6–20)
CO2: 21 mmol/L — ABNORMAL LOW (ref 22–32)
Calcium: 9.3 mg/dL (ref 8.9–10.3)
Chloride: 104 mmol/L (ref 98–111)
Creatinine, Ser: 0.82 mg/dL (ref 0.44–1.00)
GFR, Estimated: >60 mL/min (ref >60)
Glucose, Bld: 113 mg/dL — ABNORMAL HIGH (ref 70–99)
Potassium: 3.6 mmol/L (ref 3.5–5.1)
Sodium: 134 mmol/L — ABNORMAL LOW (ref 135–145)
Total Bilirubin: 0.5 mg/dL (ref 0.0–1.2)
Total Protein: 8.1 g/dL (ref 6.5–8.1)

## 2023-11-09 NOTE — ED Notes (Signed)
 She states she can not urinate. She left AMA due to a family emergency.

## 2023-11-09 NOTE — ED Triage Notes (Signed)
 Arrives pov for RLQ abdominal pain. Denies NVD.

## 2023-11-09 NOTE — ED Provider Notes (Signed)
 [facility] AT [facility] Provider Note   CSN: [id number] Arrival date & time: 11/09/23  4098     History  Chief Complaint  Patient presents with   Abdominal Pain    Nichole Hampton is a 38 y.o. female.  Patient c/o rlq pain in past couple days. Constant, dull, non radiating, without specific exacerbating or alleviating factors, waxes and wanes in intensity. No hx same pain. No dysuria or hematuria. No back pain. No fever or chills. No trauma or strain to area. No hx kidney stones, no hx appendicitis or prior abd surgery, no hx ovarian cyst, no hx gallstones.  The history is provided by the patient and medical records.  Abdominal Pain Associated symptoms: no chest pain, no cough, no diarrhea, no dysuria, no fever, no shortness of breath and no vomiting        Home Medications Prior to Admission medications   Medication Sig Start Date End Date Taking? Authorizing Provider  calcium carbonate (TUMS) 500 MG chewable tablet Chew 1 tablet by mouth 4 (four) times daily as needed for indigestion or heartburn.     [provider]  misoprostol (CYTOTEC) 200 MCG tablet Place four tablets in between your gums and cheeks (two tablets on each side) and let them dissolve before swallowing. 03/22/23   Sue Lush, FNP  ondansetron (ZOFRAN-ODT) 4 MG disintegrating tablet Take 1 tablet (4 mg total) by mouth every 6 (six) hours as needed for nausea. 03/22/23   Sue Lush, FNP  oxyCODONE-acetaminophen (PERCOCET/ROXICET) 5-325 MG tablet Take 1 tablet by mouth every 4 (four) hours as needed for severe pain. 03/22/23   Sue Lush, FNP  Prenatal Vit-Fe Fumarate-FA (PRENATAL MULTIVITAMIN) TABS tablet Take 1 tablet by mouth daily at 12 noon.    [provider]      Allergies    Patient has no known allergies.    Review of Systems   Review of Systems  Constitutional:  Negative for fever.  Respiratory:  Negative for cough and shortness of  breath.   Cardiovascular:  Negative for chest pain.  Gastrointestinal:  Positive for abdominal pain. Negative for diarrhea and vomiting.  Genitourinary:  Negative for dysuria and flank pain.  Musculoskeletal:  Negative for back pain.  Skin:  Negative for rash.  Neurological:  Negative for headaches.    Physical Exam Updated Vital Signs BP (!) 146/92 (BP Location: Left Arm)   Pulse 80   Temp 98.1 F (36.7 C) (Oral)   Resp 16   Ht 1.524 m (5')   Wt 63.5 kg   LMP 12/18/2022 (Approximate)   SpO2 95%   BMI 27.34 kg/m  Physical Exam Vitals and nursing note reviewed.  Constitutional:      Appearance: Normal appearance. She is well-developed.  HENT:     Head: Atraumatic.     Nose: Nose normal.     Mouth/Throat:     Mouth: Mucous membranes are moist.  Eyes:     General: No scleral icterus.    Conjunctiva/sclera: Conjunctivae normal.  Neck:     Trachea: No tracheal deviation.  Cardiovascular:     Rate and Rhythm: Normal rate and regular rhythm.     Pulses: Normal pulses.     Heart sounds: Normal heart sounds. No murmur heard.    No friction rub. No gallop.  Pulmonary:     Effort: Pulmonary effort is normal. No respiratory distress.     Breath sounds: Normal breath sounds.  Abdominal:  General: Bowel sounds are normal. There is no distension.     Palpations: Abdomen is soft. There is no mass.     Tenderness: There is abdominal tenderness. There is no guarding or rebound.     Hernia: No hernia is present.     Comments: Rlq tenderness  Genitourinary:    Comments: No cva tenderness.  Musculoskeletal:        General: No swelling or tenderness.     Cervical back: Neck supple. No muscular tenderness.  Skin:    General: Skin is warm and dry.     Findings: No rash.  Neurological:     Mental Status: She is alert.     Comments: Alert, speech normal.   Psychiatric:        Mood and Affect: Mood normal.     ED Results / Procedures / Treatments   Labs (all labs ordered  are listed, but only abnormal results are displayed) Results for orders placed or performed during the hospital encounter of 11/09/23  CBC   Collection Time: 11/09/23  9:56 AM  Result Value Ref Range   WBC 6.9 4.0 - 10.5 K/uL   RBC 4.38 3.87 - 5.11 MIL/uL   Hemoglobin 13.9 12.0 - 15.0 g/dL   HCT 95.1 88.4 - 16.6 %   MCV 98.4 80.0 - 100.0 fL   MCH 31.7 26.0 - 34.0 pg   MCHC 32.3 30.0 - 36.0 g/dL   RDW 06.3 01.6 - 01.0 %   Platelets 260 150 - 400 K/uL   nRBC 0.0 0.0 - 0.2 %  Comprehensive metabolic panel with GFR   Collection Time: 11/09/23  9:56 AM  Result Value Ref Range   Sodium 134 (L) 135 - 145 mmol/L   Potassium 3.6 3.5 - 5.1 mmol/L   Chloride 104 98 - 111 mmol/L   CO2 21 (L) 22 - 32 mmol/L   Glucose, Bld 113 (H) 70 - 99 mg/dL   BUN 8 6 - 20 mg/dL   Creatinine, Ser 9.32 0.44 - 1.00 mg/dL   Calcium 9.3 8.9 - 35.5 mg/dL   Total Protein 8.1 6.5 - 8.1 g/dL   Albumin 4.4 3.5 - 5.0 g/dL   AST 23 15 - 41 U/L   ALT 22 0 - 44 U/L   Alkaline Phosphatase 56 38 - 126 U/L   Total Bilirubin 0.5 0.0 - 1.2 mg/dL   GFR, Estimated >73 >22 mL/min   Anion gap 9 5 - 15  Lipase, blood   Collection Time: 11/09/23  9:56 AM  Result Value Ref Range   Lipase 32 11 - 51 U/L     EKG None  Radiology No results found.  Procedures Procedures    Medications Ordered in ED Medications - No data to display  ED Course/ Medical Decision Making/ A&P                                 Medical Decision Making Problems Addressed: Elevated blood pressure reading: acute illness or injury RLQ abdominal pain: acute illness or injury with systemic symptoms that poses a threat to life or bodily functions  Amount and/or Complexity of Data Reviewed External Data Reviewed: notes. Labs: ordered. Decision-making details documented in ED Course. Radiology: ordered.  Risk Decision regarding hospitalization.   Iv ns. Labs ordered/sent. Imaging ordered.   Differential diagnosis includes  appendicitis, ovarian cyst/torsion, biliary colic/gallstones, uti/pyelo, etc. Dispo decision including potential need for admission  considered - will get labs and imaging and reassess.   Reviewed nursing notes and prior charts for additional history. External reports reviewed.    Labs reviewed/interpreted by me - wbc and hgb normal. Chem largely unremarkable. Urine pending.   CT reviewed/interpreted by me - pnd.   1220, urine and ct pending. Went to check patient - patient had left ED AMA without notifying EDP, prior to completion of ED eval and treatment.            Final Clinical Impression(s) / ED Diagnoses Final diagnoses:  None    Rx / DC Orders ED Discharge Orders     None         Cathren Laine, MD 11/09/23 1230
